# Patient Record
Sex: Male | Born: 1970 | Race: White | Hispanic: No | Marital: Married | Smoking: Never smoker
Health system: Southern US, Community
[De-identification: ages and names within clinical notes are randomized; demographics above are authoritative.]

## PROBLEM LIST (undated history)

## (undated) DIAGNOSIS — M1990 Unspecified osteoarthritis, unspecified site: Secondary | ICD-10-CM

## (undated) DIAGNOSIS — Z8669 Personal history of other diseases of the nervous system and sense organs: Secondary | ICD-10-CM

## (undated) DIAGNOSIS — I10 Essential (primary) hypertension: Secondary | ICD-10-CM

## (undated) DIAGNOSIS — G8929 Other chronic pain: Secondary | ICD-10-CM

## (undated) DIAGNOSIS — E785 Hyperlipidemia, unspecified: Secondary | ICD-10-CM

## (undated) DIAGNOSIS — F4310 Post-traumatic stress disorder, unspecified: Secondary | ICD-10-CM

## (undated) DIAGNOSIS — Z87898 Personal history of other specified conditions: Secondary | ICD-10-CM

## (undated) HISTORY — DX: Other chronic pain: G89.29

## (undated) HISTORY — PX: NECK SURGERY: SHX720

## (undated) HISTORY — DX: Essential (primary) hypertension: I10

## (undated) HISTORY — PX: HERNIA REPAIR: SHX51

## (undated) HISTORY — DX: Post-traumatic stress disorder, unspecified: F43.10

## (undated) HISTORY — DX: Personal history of other diseases of the nervous system and sense organs: Z86.69

## (undated) HISTORY — DX: Personal history of other specified conditions: Z87.898

## (undated) HISTORY — DX: Unspecified osteoarthritis, unspecified site: M19.90

## (undated) HISTORY — DX: Hyperlipidemia, unspecified: E78.5

---

## 2003-07-18 HISTORY — PX: WRIST SURGERY: SHX841

## 2003-07-18 HISTORY — PX: HIP SURGERY: SHX245

## 2003-07-18 HISTORY — PX: OTHER SURGICAL HISTORY: SHX169

## 2003-07-18 HISTORY — PX: KNEE SURGERY: SHX244

## 2003-07-18 HISTORY — PX: HERNIA REPAIR: SHX51

## 2004-08-01 ENCOUNTER — Encounter: Admission: RE | Admit: 2004-08-01 | Discharge: 2004-08-01 | Payer: Self-pay | Admitting: Orthopedic Surgery

## 2009-08-17 ENCOUNTER — Encounter: Admission: RE | Admit: 2009-08-17 | Discharge: 2009-08-17 | Payer: Self-pay | Admitting: Orthopaedic Surgery

## 2011-07-19 DIAGNOSIS — S32409A Unspecified fracture of unspecified acetabulum, initial encounter for closed fracture: Secondary | ICD-10-CM | POA: Diagnosis not present

## 2011-09-19 DIAGNOSIS — M169 Osteoarthritis of hip, unspecified: Secondary | ICD-10-CM | POA: Diagnosis not present

## 2011-09-19 DIAGNOSIS — IMO0002 Reserved for concepts with insufficient information to code with codable children: Secondary | ICD-10-CM | POA: Diagnosis not present

## 2011-09-19 DIAGNOSIS — I10 Essential (primary) hypertension: Secondary | ICD-10-CM | POA: Insufficient documentation

## 2011-09-19 DIAGNOSIS — K219 Gastro-esophageal reflux disease without esophagitis: Secondary | ICD-10-CM | POA: Insufficient documentation

## 2011-09-19 DIAGNOSIS — E7800 Pure hypercholesterolemia, unspecified: Secondary | ICD-10-CM | POA: Insufficient documentation

## 2011-09-19 DIAGNOSIS — Z09 Encounter for follow-up examination after completed treatment for conditions other than malignant neoplasm: Secondary | ICD-10-CM | POA: Diagnosis not present

## 2011-09-19 DIAGNOSIS — M1710 Unilateral primary osteoarthritis, unspecified knee: Secondary | ICD-10-CM | POA: Diagnosis not present

## 2011-10-05 DIAGNOSIS — G4700 Insomnia, unspecified: Secondary | ICD-10-CM | POA: Diagnosis not present

## 2011-10-05 DIAGNOSIS — Z125 Encounter for screening for malignant neoplasm of prostate: Secondary | ICD-10-CM | POA: Diagnosis not present

## 2011-10-05 DIAGNOSIS — E785 Hyperlipidemia, unspecified: Secondary | ICD-10-CM | POA: Diagnosis not present

## 2011-10-05 DIAGNOSIS — M1990 Unspecified osteoarthritis, unspecified site: Secondary | ICD-10-CM | POA: Diagnosis not present

## 2011-10-05 DIAGNOSIS — I10 Essential (primary) hypertension: Secondary | ICD-10-CM | POA: Diagnosis not present

## 2012-01-04 DIAGNOSIS — Z6837 Body mass index (BMI) 37.0-37.9, adult: Secondary | ICD-10-CM | POA: Diagnosis not present

## 2012-01-04 DIAGNOSIS — M1990 Unspecified osteoarthritis, unspecified site: Secondary | ICD-10-CM | POA: Diagnosis not present

## 2012-01-04 DIAGNOSIS — Z79899 Other long term (current) drug therapy: Secondary | ICD-10-CM | POA: Diagnosis not present

## 2012-01-04 DIAGNOSIS — G4700 Insomnia, unspecified: Secondary | ICD-10-CM | POA: Diagnosis not present

## 2012-01-04 DIAGNOSIS — E785 Hyperlipidemia, unspecified: Secondary | ICD-10-CM | POA: Diagnosis not present

## 2012-01-04 DIAGNOSIS — E669 Obesity, unspecified: Secondary | ICD-10-CM | POA: Diagnosis not present

## 2012-01-04 DIAGNOSIS — I10 Essential (primary) hypertension: Secondary | ICD-10-CM | POA: Diagnosis not present

## 2012-03-22 DIAGNOSIS — IMO0002 Reserved for concepts with insufficient information to code with codable children: Secondary | ICD-10-CM | POA: Diagnosis not present

## 2012-03-22 DIAGNOSIS — M1710 Unilateral primary osteoarthritis, unspecified knee: Secondary | ICD-10-CM | POA: Diagnosis not present

## 2012-03-22 DIAGNOSIS — G8921 Chronic pain due to trauma: Secondary | ICD-10-CM | POA: Diagnosis not present

## 2012-05-02 DIAGNOSIS — I10 Essential (primary) hypertension: Secondary | ICD-10-CM | POA: Diagnosis not present

## 2012-05-02 DIAGNOSIS — G4700 Insomnia, unspecified: Secondary | ICD-10-CM | POA: Diagnosis not present

## 2012-05-02 DIAGNOSIS — E785 Hyperlipidemia, unspecified: Secondary | ICD-10-CM | POA: Diagnosis not present

## 2012-05-02 DIAGNOSIS — M1990 Unspecified osteoarthritis, unspecified site: Secondary | ICD-10-CM | POA: Diagnosis not present

## 2012-07-29 DIAGNOSIS — M1710 Unilateral primary osteoarthritis, unspecified knee: Secondary | ICD-10-CM | POA: Diagnosis not present

## 2012-07-29 DIAGNOSIS — IMO0001 Reserved for inherently not codable concepts without codable children: Secondary | ICD-10-CM | POA: Diagnosis not present

## 2012-07-29 DIAGNOSIS — K219 Gastro-esophageal reflux disease without esophagitis: Secondary | ICD-10-CM | POA: Diagnosis not present

## 2012-07-29 DIAGNOSIS — M129 Arthropathy, unspecified: Secondary | ICD-10-CM | POA: Diagnosis not present

## 2012-07-29 DIAGNOSIS — I10 Essential (primary) hypertension: Secondary | ICD-10-CM | POA: Diagnosis not present

## 2012-07-29 DIAGNOSIS — M76899 Other specified enthesopathies of unspecified lower limb, excluding foot: Secondary | ICD-10-CM | POA: Diagnosis not present

## 2012-07-29 DIAGNOSIS — G8921 Chronic pain due to trauma: Secondary | ICD-10-CM | POA: Diagnosis not present

## 2012-07-29 DIAGNOSIS — S32409A Unspecified fracture of unspecified acetabulum, initial encounter for closed fracture: Secondary | ICD-10-CM | POA: Diagnosis not present

## 2012-07-29 DIAGNOSIS — Z09 Encounter for follow-up examination after completed treatment for conditions other than malignant neoplasm: Secondary | ICD-10-CM | POA: Diagnosis not present

## 2012-09-02 DIAGNOSIS — I10 Essential (primary) hypertension: Secondary | ICD-10-CM | POA: Diagnosis not present

## 2012-09-02 DIAGNOSIS — G4700 Insomnia, unspecified: Secondary | ICD-10-CM | POA: Diagnosis not present

## 2012-09-02 DIAGNOSIS — M1990 Unspecified osteoarthritis, unspecified site: Secondary | ICD-10-CM | POA: Diagnosis not present

## 2012-09-02 DIAGNOSIS — E785 Hyperlipidemia, unspecified: Secondary | ICD-10-CM | POA: Diagnosis not present

## 2012-10-18 DIAGNOSIS — IMO0002 Reserved for concepts with insufficient information to code with codable children: Secondary | ICD-10-CM | POA: Diagnosis not present

## 2012-10-18 DIAGNOSIS — M2340 Loose body in knee, unspecified knee: Secondary | ICD-10-CM | POA: Diagnosis not present

## 2012-10-18 DIAGNOSIS — M1710 Unilateral primary osteoarthritis, unspecified knee: Secondary | ICD-10-CM | POA: Diagnosis not present

## 2013-01-24 DIAGNOSIS — I10 Essential (primary) hypertension: Secondary | ICD-10-CM | POA: Diagnosis not present

## 2013-01-24 DIAGNOSIS — M1990 Unspecified osteoarthritis, unspecified site: Secondary | ICD-10-CM | POA: Diagnosis not present

## 2013-01-24 DIAGNOSIS — E785 Hyperlipidemia, unspecified: Secondary | ICD-10-CM | POA: Diagnosis not present

## 2013-01-24 DIAGNOSIS — G4700 Insomnia, unspecified: Secondary | ICD-10-CM | POA: Diagnosis not present

## 2013-04-18 DIAGNOSIS — M1710 Unilateral primary osteoarthritis, unspecified knee: Secondary | ICD-10-CM | POA: Diagnosis not present

## 2013-04-18 DIAGNOSIS — IMO0002 Reserved for concepts with insufficient information to code with codable children: Secondary | ICD-10-CM | POA: Diagnosis not present

## 2013-04-18 DIAGNOSIS — Z9889 Other specified postprocedural states: Secondary | ICD-10-CM | POA: Diagnosis not present

## 2013-05-26 DIAGNOSIS — G4700 Insomnia, unspecified: Secondary | ICD-10-CM | POA: Diagnosis not present

## 2013-05-26 DIAGNOSIS — M1990 Unspecified osteoarthritis, unspecified site: Secondary | ICD-10-CM | POA: Diagnosis not present

## 2013-05-26 DIAGNOSIS — E785 Hyperlipidemia, unspecified: Secondary | ICD-10-CM | POA: Diagnosis not present

## 2013-05-26 DIAGNOSIS — I10 Essential (primary) hypertension: Secondary | ICD-10-CM | POA: Diagnosis not present

## 2013-05-28 DIAGNOSIS — I6350 Cerebral infarction due to unspecified occlusion or stenosis of unspecified cerebral artery: Secondary | ICD-10-CM | POA: Diagnosis not present

## 2013-05-28 DIAGNOSIS — Z79899 Other long term (current) drug therapy: Secondary | ICD-10-CM | POA: Diagnosis not present

## 2013-05-28 DIAGNOSIS — I10 Essential (primary) hypertension: Secondary | ICD-10-CM | POA: Diagnosis not present

## 2013-05-28 DIAGNOSIS — R51 Headache: Secondary | ICD-10-CM | POA: Diagnosis not present

## 2013-05-28 DIAGNOSIS — G510 Bell's palsy: Secondary | ICD-10-CM | POA: Diagnosis not present

## 2013-05-30 DIAGNOSIS — G510 Bell's palsy: Secondary | ICD-10-CM | POA: Diagnosis not present

## 2013-07-30 DIAGNOSIS — IMO0002 Reserved for concepts with insufficient information to code with codable children: Secondary | ICD-10-CM | POA: Diagnosis not present

## 2013-07-30 DIAGNOSIS — G8921 Chronic pain due to trauma: Secondary | ICD-10-CM | POA: Diagnosis not present

## 2013-07-30 DIAGNOSIS — S72009D Fracture of unspecified part of neck of unspecified femur, subsequent encounter for closed fracture with routine healing: Secondary | ICD-10-CM | POA: Diagnosis not present

## 2013-07-30 DIAGNOSIS — S32409A Unspecified fracture of unspecified acetabulum, initial encounter for closed fracture: Secondary | ICD-10-CM | POA: Diagnosis not present

## 2013-10-17 DIAGNOSIS — M1710 Unilateral primary osteoarthritis, unspecified knee: Secondary | ICD-10-CM | POA: Diagnosis not present

## 2013-10-17 DIAGNOSIS — M175 Other unilateral secondary osteoarthritis of knee: Secondary | ICD-10-CM | POA: Diagnosis not present

## 2013-10-17 DIAGNOSIS — Z9889 Other specified postprocedural states: Secondary | ICD-10-CM | POA: Diagnosis not present

## 2013-10-17 DIAGNOSIS — M167 Other unilateral secondary osteoarthritis of hip: Secondary | ICD-10-CM | POA: Diagnosis not present

## 2013-12-03 DIAGNOSIS — G4700 Insomnia, unspecified: Secondary | ICD-10-CM | POA: Diagnosis not present

## 2013-12-03 DIAGNOSIS — E785 Hyperlipidemia, unspecified: Secondary | ICD-10-CM | POA: Diagnosis not present

## 2013-12-03 DIAGNOSIS — I10 Essential (primary) hypertension: Secondary | ICD-10-CM | POA: Diagnosis not present

## 2014-01-05 DIAGNOSIS — I10 Essential (primary) hypertension: Secondary | ICD-10-CM | POA: Diagnosis not present

## 2014-01-05 DIAGNOSIS — R748 Abnormal levels of other serum enzymes: Secondary | ICD-10-CM | POA: Diagnosis not present

## 2014-01-05 DIAGNOSIS — E785 Hyperlipidemia, unspecified: Secondary | ICD-10-CM | POA: Diagnosis not present

## 2014-06-01 DIAGNOSIS — I10 Essential (primary) hypertension: Secondary | ICD-10-CM | POA: Diagnosis not present

## 2014-06-01 DIAGNOSIS — E785 Hyperlipidemia, unspecified: Secondary | ICD-10-CM | POA: Diagnosis not present

## 2014-06-01 DIAGNOSIS — M1990 Unspecified osteoarthritis, unspecified site: Secondary | ICD-10-CM | POA: Diagnosis not present

## 2014-06-01 DIAGNOSIS — G4700 Insomnia, unspecified: Secondary | ICD-10-CM | POA: Diagnosis not present

## 2014-06-01 DIAGNOSIS — Z6837 Body mass index (BMI) 37.0-37.9, adult: Secondary | ICD-10-CM | POA: Diagnosis not present

## 2014-09-07 DIAGNOSIS — I10 Essential (primary) hypertension: Secondary | ICD-10-CM | POA: Diagnosis not present

## 2014-09-07 DIAGNOSIS — Z760 Encounter for issue of repeat prescription: Secondary | ICD-10-CM | POA: Diagnosis not present

## 2014-09-07 DIAGNOSIS — Z9889 Other specified postprocedural states: Secondary | ICD-10-CM | POA: Diagnosis not present

## 2014-09-07 DIAGNOSIS — S32401D Unspecified fracture of right acetabulum, subsequent encounter for fracture with routine healing: Secondary | ICD-10-CM | POA: Diagnosis not present

## 2014-09-07 DIAGNOSIS — S32401S Unspecified fracture of right acetabulum, sequela: Secondary | ICD-10-CM | POA: Diagnosis not present

## 2014-10-21 DIAGNOSIS — M1651 Unilateral post-traumatic osteoarthritis, right hip: Secondary | ICD-10-CM | POA: Diagnosis not present

## 2014-10-21 DIAGNOSIS — M2341 Loose body in knee, right knee: Secondary | ICD-10-CM | POA: Diagnosis not present

## 2014-10-21 DIAGNOSIS — M1731 Unilateral post-traumatic osteoarthritis, right knee: Secondary | ICD-10-CM | POA: Diagnosis not present

## 2014-12-01 DIAGNOSIS — G4700 Insomnia, unspecified: Secondary | ICD-10-CM | POA: Diagnosis not present

## 2014-12-01 DIAGNOSIS — Z6836 Body mass index (BMI) 36.0-36.9, adult: Secondary | ICD-10-CM | POA: Diagnosis not present

## 2014-12-01 DIAGNOSIS — E785 Hyperlipidemia, unspecified: Secondary | ICD-10-CM | POA: Diagnosis not present

## 2014-12-01 DIAGNOSIS — M1990 Unspecified osteoarthritis, unspecified site: Secondary | ICD-10-CM | POA: Diagnosis not present

## 2014-12-01 DIAGNOSIS — I10 Essential (primary) hypertension: Secondary | ICD-10-CM | POA: Diagnosis not present

## 2015-05-21 DIAGNOSIS — G4700 Insomnia, unspecified: Secondary | ICD-10-CM | POA: Diagnosis not present

## 2015-05-21 DIAGNOSIS — E785 Hyperlipidemia, unspecified: Secondary | ICD-10-CM | POA: Diagnosis not present

## 2015-05-21 DIAGNOSIS — M1990 Unspecified osteoarthritis, unspecified site: Secondary | ICD-10-CM | POA: Diagnosis not present

## 2015-05-21 DIAGNOSIS — S00522A Blister (nonthermal) of oral cavity, initial encounter: Secondary | ICD-10-CM | POA: Diagnosis not present

## 2015-05-21 DIAGNOSIS — Z6835 Body mass index (BMI) 35.0-35.9, adult: Secondary | ICD-10-CM | POA: Diagnosis not present

## 2015-05-31 DIAGNOSIS — E785 Hyperlipidemia, unspecified: Secondary | ICD-10-CM | POA: Diagnosis not present

## 2015-05-31 DIAGNOSIS — Z6835 Body mass index (BMI) 35.0-35.9, adult: Secondary | ICD-10-CM | POA: Diagnosis not present

## 2015-05-31 DIAGNOSIS — Z0000 Encounter for general adult medical examination without abnormal findings: Secondary | ICD-10-CM | POA: Diagnosis not present

## 2015-05-31 DIAGNOSIS — I10 Essential (primary) hypertension: Secondary | ICD-10-CM | POA: Diagnosis not present

## 2015-05-31 DIAGNOSIS — M1990 Unspecified osteoarthritis, unspecified site: Secondary | ICD-10-CM | POA: Diagnosis not present

## 2015-09-08 DIAGNOSIS — I10 Essential (primary) hypertension: Secondary | ICD-10-CM | POA: Diagnosis not present

## 2015-09-08 DIAGNOSIS — S32401D Unspecified fracture of right acetabulum, subsequent encounter for fracture with routine healing: Secondary | ICD-10-CM | POA: Diagnosis not present

## 2015-09-08 DIAGNOSIS — Z79899 Other long term (current) drug therapy: Secondary | ICD-10-CM | POA: Diagnosis not present

## 2015-09-08 DIAGNOSIS — E7800 Pure hypercholesterolemia, unspecified: Secondary | ICD-10-CM | POA: Diagnosis not present

## 2015-09-08 DIAGNOSIS — Z7982 Long term (current) use of aspirin: Secondary | ICD-10-CM | POA: Diagnosis not present

## 2015-11-16 DIAGNOSIS — M1731 Unilateral post-traumatic osteoarthritis, right knee: Secondary | ICD-10-CM | POA: Diagnosis not present

## 2015-12-21 DIAGNOSIS — F4312 Post-traumatic stress disorder, chronic: Secondary | ICD-10-CM | POA: Diagnosis not present

## 2015-12-21 DIAGNOSIS — Z6836 Body mass index (BMI) 36.0-36.9, adult: Secondary | ICD-10-CM | POA: Diagnosis not present

## 2015-12-21 DIAGNOSIS — E785 Hyperlipidemia, unspecified: Secondary | ICD-10-CM | POA: Diagnosis not present

## 2015-12-21 DIAGNOSIS — I10 Essential (primary) hypertension: Secondary | ICD-10-CM | POA: Diagnosis not present

## 2015-12-21 DIAGNOSIS — G4700 Insomnia, unspecified: Secondary | ICD-10-CM | POA: Diagnosis not present

## 2015-12-21 DIAGNOSIS — M1990 Unspecified osteoarthritis, unspecified site: Secondary | ICD-10-CM | POA: Diagnosis not present

## 2016-02-28 DIAGNOSIS — G8921 Chronic pain due to trauma: Secondary | ICD-10-CM | POA: Diagnosis not present

## 2016-02-28 DIAGNOSIS — Z6835 Body mass index (BMI) 35.0-35.9, adult: Secondary | ICD-10-CM | POA: Diagnosis not present

## 2016-02-28 DIAGNOSIS — F4312 Post-traumatic stress disorder, chronic: Secondary | ICD-10-CM | POA: Diagnosis not present

## 2016-02-28 DIAGNOSIS — M1990 Unspecified osteoarthritis, unspecified site: Secondary | ICD-10-CM | POA: Diagnosis not present

## 2016-05-29 DIAGNOSIS — G894 Chronic pain syndrome: Secondary | ICD-10-CM | POA: Diagnosis not present

## 2016-05-29 DIAGNOSIS — M159 Polyosteoarthritis, unspecified: Secondary | ICD-10-CM | POA: Diagnosis not present

## 2016-05-29 DIAGNOSIS — M25551 Pain in right hip: Secondary | ICD-10-CM | POA: Diagnosis not present

## 2016-05-29 DIAGNOSIS — Z79891 Long term (current) use of opiate analgesic: Secondary | ICD-10-CM | POA: Diagnosis not present

## 2016-06-19 DIAGNOSIS — M545 Low back pain: Secondary | ICD-10-CM | POA: Diagnosis not present

## 2016-06-19 DIAGNOSIS — G8921 Chronic pain due to trauma: Secondary | ICD-10-CM | POA: Diagnosis not present

## 2016-06-19 DIAGNOSIS — G4700 Insomnia, unspecified: Secondary | ICD-10-CM | POA: Diagnosis not present

## 2016-06-19 DIAGNOSIS — I10 Essential (primary) hypertension: Secondary | ICD-10-CM | POA: Diagnosis not present

## 2016-06-19 DIAGNOSIS — Z6837 Body mass index (BMI) 37.0-37.9, adult: Secondary | ICD-10-CM | POA: Diagnosis not present

## 2016-06-19 DIAGNOSIS — E785 Hyperlipidemia, unspecified: Secondary | ICD-10-CM | POA: Diagnosis not present

## 2016-06-19 DIAGNOSIS — F4312 Post-traumatic stress disorder, chronic: Secondary | ICD-10-CM | POA: Diagnosis not present

## 2016-09-13 DIAGNOSIS — M25361 Other instability, right knee: Secondary | ICD-10-CM | POA: Diagnosis not present

## 2016-09-13 DIAGNOSIS — M1731 Unilateral post-traumatic osteoarthritis, right knee: Secondary | ICD-10-CM | POA: Diagnosis not present

## 2016-09-13 DIAGNOSIS — Z9889 Other specified postprocedural states: Secondary | ICD-10-CM | POA: Diagnosis not present

## 2016-09-13 DIAGNOSIS — S32401D Unspecified fracture of right acetabulum, subsequent encounter for fracture with routine healing: Secondary | ICD-10-CM | POA: Diagnosis not present

## 2016-09-13 DIAGNOSIS — M7061 Trochanteric bursitis, right hip: Secondary | ICD-10-CM | POA: Diagnosis not present

## 2016-12-13 DIAGNOSIS — G4700 Insomnia, unspecified: Secondary | ICD-10-CM | POA: Diagnosis not present

## 2016-12-13 DIAGNOSIS — Z6837 Body mass index (BMI) 37.0-37.9, adult: Secondary | ICD-10-CM | POA: Diagnosis not present

## 2016-12-13 DIAGNOSIS — M545 Low back pain: Secondary | ICD-10-CM | POA: Diagnosis not present

## 2016-12-13 DIAGNOSIS — I10 Essential (primary) hypertension: Secondary | ICD-10-CM | POA: Diagnosis not present

## 2016-12-13 DIAGNOSIS — M1990 Unspecified osteoarthritis, unspecified site: Secondary | ICD-10-CM | POA: Diagnosis not present

## 2016-12-13 DIAGNOSIS — E785 Hyperlipidemia, unspecified: Secondary | ICD-10-CM | POA: Diagnosis not present

## 2016-12-13 DIAGNOSIS — F4312 Post-traumatic stress disorder, chronic: Secondary | ICD-10-CM | POA: Diagnosis not present

## 2017-01-30 DIAGNOSIS — Z79899 Other long term (current) drug therapy: Secondary | ICD-10-CM | POA: Diagnosis not present

## 2017-01-30 DIAGNOSIS — I10 Essential (primary) hypertension: Secondary | ICD-10-CM | POA: Diagnosis not present

## 2017-01-30 DIAGNOSIS — D508 Other iron deficiency anemias: Secondary | ICD-10-CM | POA: Diagnosis not present

## 2017-02-02 DIAGNOSIS — M545 Low back pain: Secondary | ICD-10-CM | POA: Diagnosis not present

## 2017-03-22 DIAGNOSIS — Z6837 Body mass index (BMI) 37.0-37.9, adult: Secondary | ICD-10-CM | POA: Diagnosis not present

## 2017-03-22 DIAGNOSIS — M5414 Radiculopathy, thoracic region: Secondary | ICD-10-CM | POA: Diagnosis not present

## 2017-04-19 DIAGNOSIS — Z6837 Body mass index (BMI) 37.0-37.9, adult: Secondary | ICD-10-CM | POA: Diagnosis not present

## 2017-04-19 DIAGNOSIS — M5412 Radiculopathy, cervical region: Secondary | ICD-10-CM | POA: Diagnosis not present

## 2017-04-19 DIAGNOSIS — M5414 Radiculopathy, thoracic region: Secondary | ICD-10-CM | POA: Diagnosis not present

## 2017-06-11 DIAGNOSIS — I10 Essential (primary) hypertension: Secondary | ICD-10-CM | POA: Diagnosis not present

## 2017-06-11 DIAGNOSIS — G4700 Insomnia, unspecified: Secondary | ICD-10-CM | POA: Diagnosis not present

## 2017-06-11 DIAGNOSIS — E785 Hyperlipidemia, unspecified: Secondary | ICD-10-CM | POA: Diagnosis not present

## 2017-06-11 DIAGNOSIS — Z6837 Body mass index (BMI) 37.0-37.9, adult: Secondary | ICD-10-CM | POA: Diagnosis not present

## 2017-06-11 DIAGNOSIS — M1990 Unspecified osteoarthritis, unspecified site: Secondary | ICD-10-CM | POA: Diagnosis not present

## 2017-06-13 ENCOUNTER — Other Ambulatory Visit: Payer: Self-pay | Admitting: Nurse Practitioner

## 2017-06-13 DIAGNOSIS — M542 Cervicalgia: Secondary | ICD-10-CM

## 2017-06-22 ENCOUNTER — Ambulatory Visit
Admission: RE | Admit: 2017-06-22 | Discharge: 2017-06-22 | Disposition: A | Discharge status: Home / Self Care | Payer: Medicare Other | Source: Ambulatory Visit | Attending: Nurse Practitioner | Admitting: Nurse Practitioner

## 2017-06-22 DIAGNOSIS — M542 Cervicalgia: Secondary | ICD-10-CM

## 2017-06-22 DIAGNOSIS — M4802 Spinal stenosis, cervical region: Secondary | ICD-10-CM | POA: Diagnosis not present

## 2018-09-23 IMAGING — MR MR CERVICAL SPINE W/O CM
5 series · 28 of 48 positions shown · non-contrast
Comparison: None available.

CLINICAL DATA: Initial evaluation for 3 month history of severe
cervical pain radiating into right upper extremity with tingling and
weakness.

EXAM:
MRI CERVICAL SPINE WITHOUT CONTRAST
TECHNIQUE: Multiplanar, multisequence MR imaging of the cervical spine was
performed. No intravenous contrast was administered.

[Series 3: T2 · sagittal · 3.0mm · 0.66mm/px · 6 of 15 slices shown (1 of 2)]
[im 1/15]
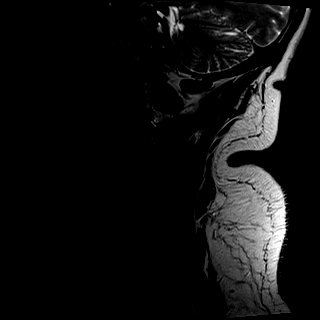
[im 3/15]
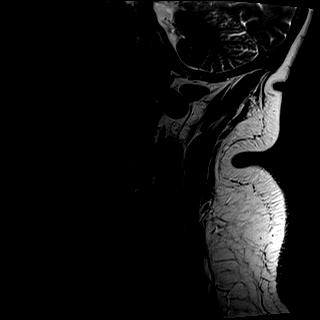
[im 6/15]
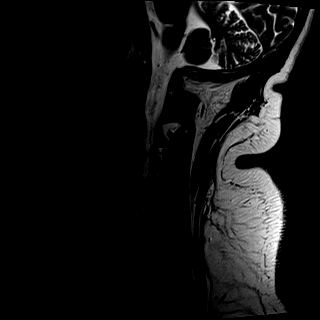
[im 9/15]
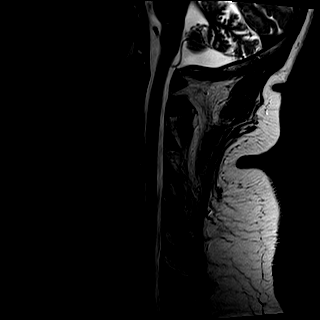
[im 12/15]
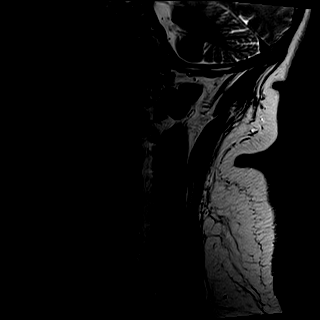
[im 15/15]
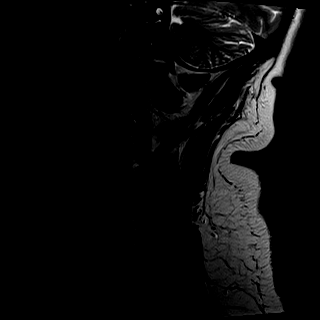

[Series 4: tir sag · sagittal · 3.0mm · 0.41mm/px · 6 of 15 slices shown]
[im 1/15]
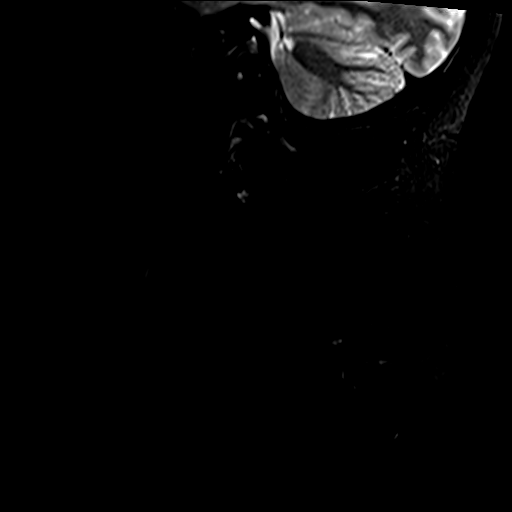
[im 3/15]
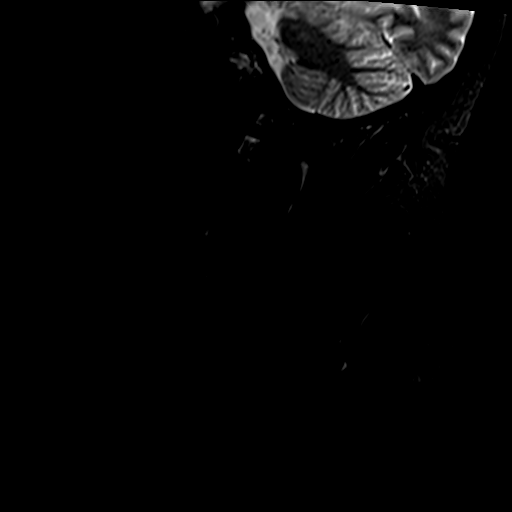
[im 6/15]
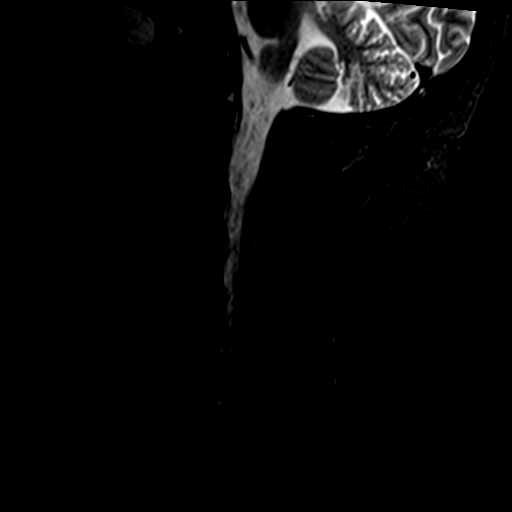
[im 9/15]
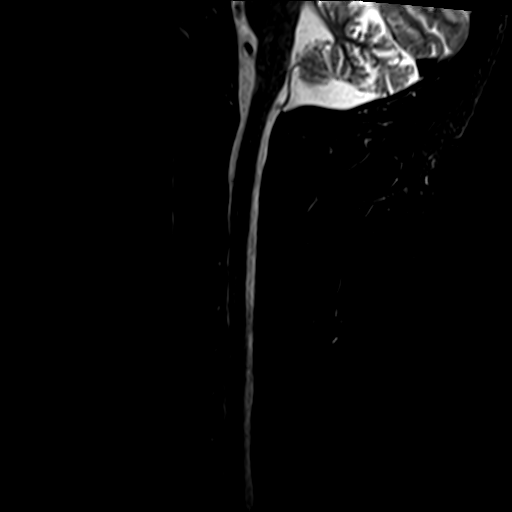
[im 12/15]
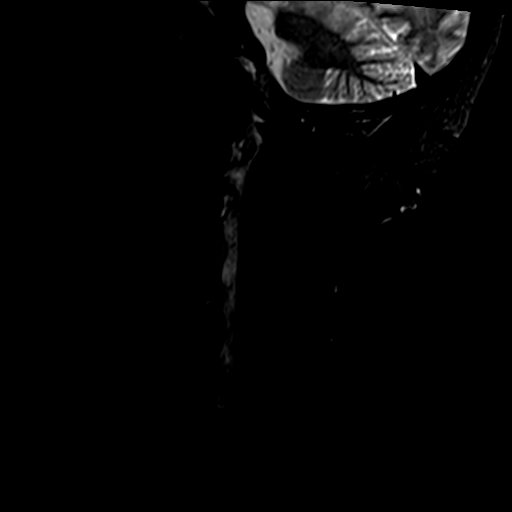
[im 15/15]
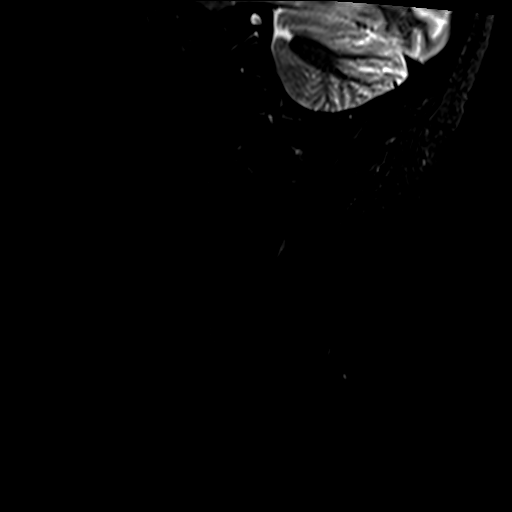

[Series 5: T1 · sagittal · 3.0mm · 0.41mm/px · 6 of 15 slices shown]
[im 1/15]
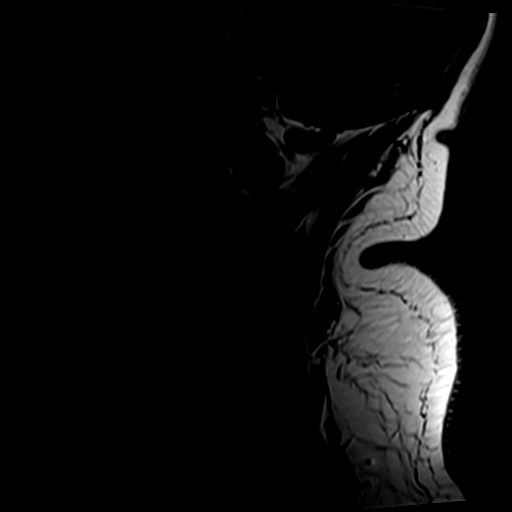
[im 3/15]
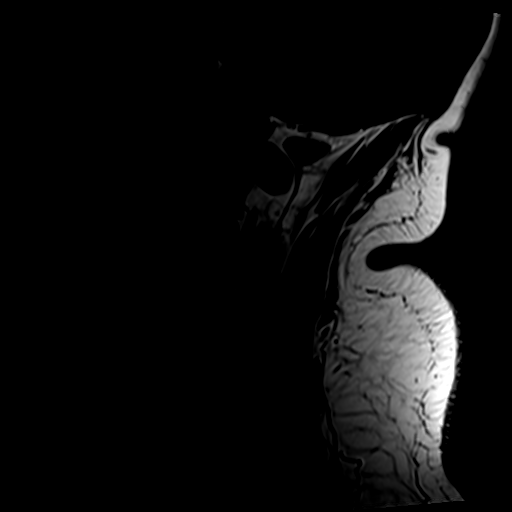
[im 6/15]
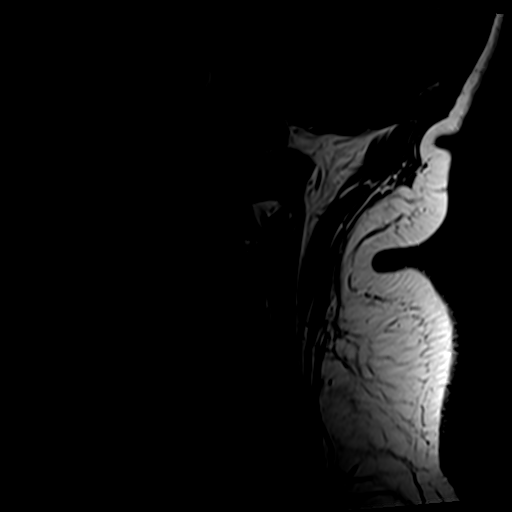
[im 9/15]
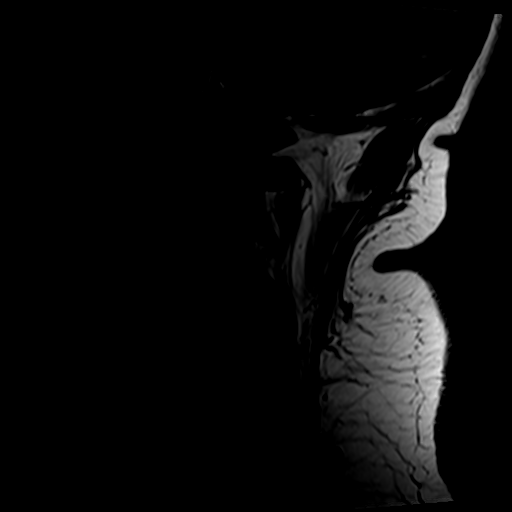
[im 12/15]
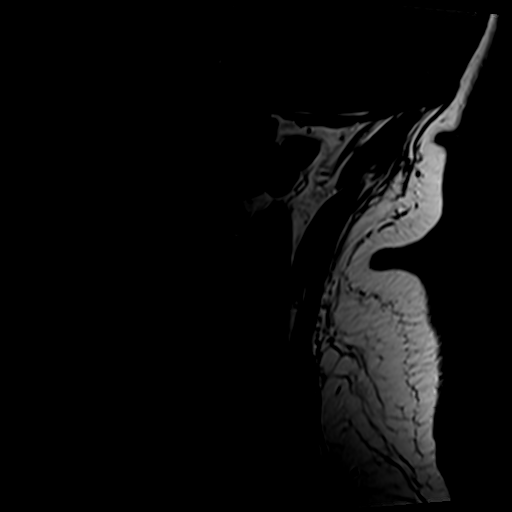
[im 15/15]
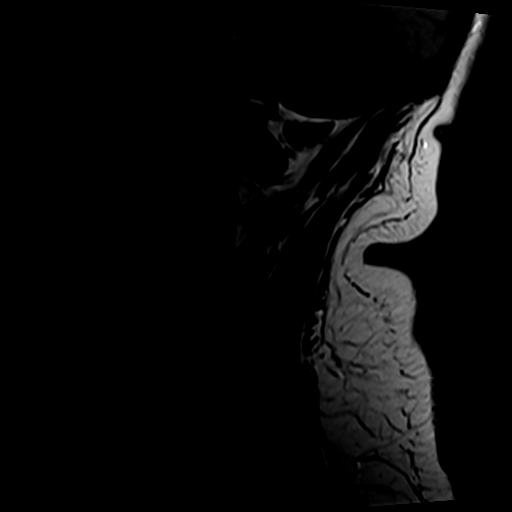

[Series 6: GRE · axial · 3.0mm · 0.35mm/px · 1 of 35 slices shown]
[im 1/35]
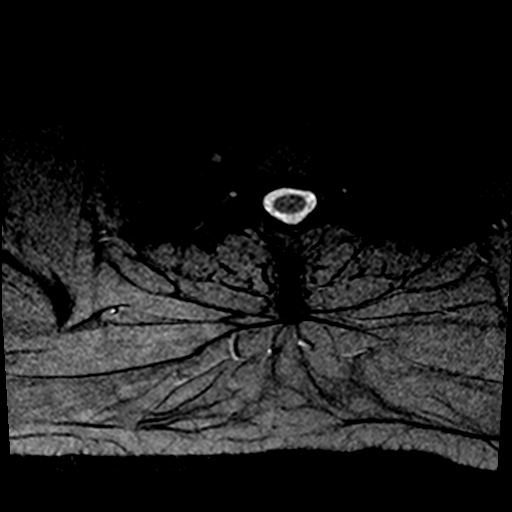

[Series 7: T2 · axial · 3.0mm · 0.70mm/px · z∈[-128,-3]mm · 9 of 35 slices shown (2 of 2)]
[im 1/35]
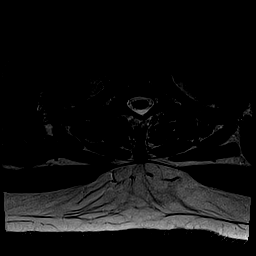
[im 5/35]
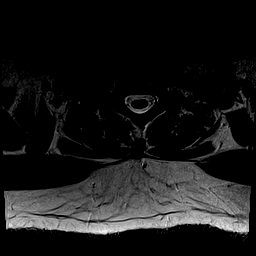
[im 10/35]
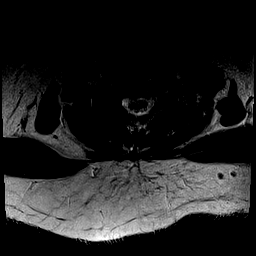
[im 15/35]
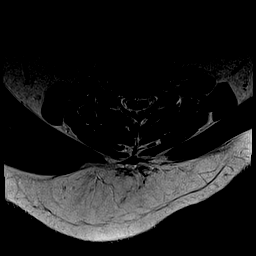
[im 18/35]
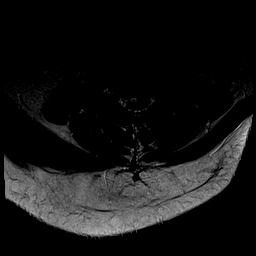
[im 20/35]
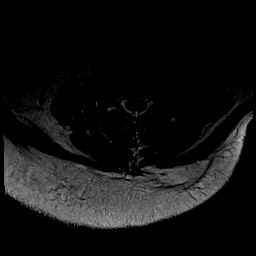
[im 25/35]
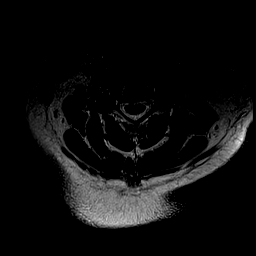
[im 30/35]
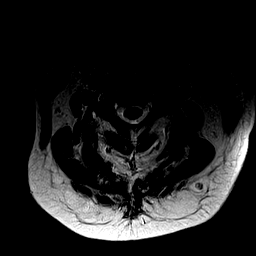
[im 35/35]
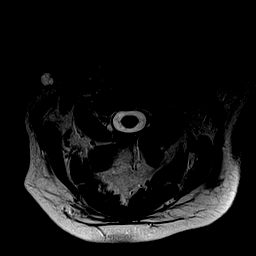

[28 of 48 positions shown; findings below may reference images not displayed]

FINDINGS: Alignment: Straightening of the normal cervical lordosis. No
listhesis.

Vertebrae: Vertebral body heights are well maintained without
evidence for acute or chronic fracture. Bone marrow signal intensity
within normal limits. No discrete or worrisome osseous lesions. No
abnormal marrow edema.

Cord: Signal intensity within the cervical spinal cord is normal.

Posterior Fossa, vertebral arteries, paraspinal tissues: Visualized
brain and posterior fossa within normal limits. Craniocervical
junction normal. Paraspinous and prevertebral soft tissues normal.
Normal intravascular flow voids present within the vertebral
arteries bilaterally. 14 mm multi cystic T2 hyperintense lesion
noted within the right parotid gland (series 7, image 2),
indeterminate.

Disc levels:

C2-C3: Minimal uncovertebral spurring with left-sided facet
hypertrophy. No stenosis.

C3-C4: Mild right-sided uncovertebral hypertrophy. No significant
canal stenosis. Borderline right foraminal narrowing.

C4-C5:  Mild diffuse disc bulge.  No significant stenosis.

C5-C6: Diffuse disc bulge with mild uncovertebral hypertrophy.
Bulging disc flattens the ventral thecal sac with resultant mild
spinal stenosis. Moderate bilateral C6 foraminal narrowing, right
slightly worse than left.

C6-C7: Broad right foraminal disc protrusion (series 6, image 24).
Superimposed mild right uncovertebral spurring. Protruding disc
flattens the right ventral thecal sac and right hemi cord without
cord signal changes. Resultant right foraminal stenosis with
probable C7 nerve root impingement. Mild spinal stenosis. Mild left
foraminal narrowing related to disc bulge and uncovertebral disease.

C7-T1: Shallow posterior disc bulge. Mild facet hypertrophy. No
stenosis.

Visualized upper thoracic spine within normal limits.
IMPRESSION: 1. Moderate sized right foraminal disc protrusion at C6-7 with
resultant severe right foraminal stenosis and probable C7 nerve root
impingement.
2. Diffuse disc bulge at C5-6 with resultant mild canal and moderate
bilateral C6 foraminal stenosis.
3. 14 mm T2 hyperintense right parotid lobe lesion, indeterminate.
Nonemergent ENT consultation and referral suggested.

## 2020-07-28 DIAGNOSIS — Z1389 Encounter for screening for other disorder: Secondary | ICD-10-CM | POA: Diagnosis not present

## 2020-07-28 DIAGNOSIS — M1250 Traumatic arthropathy, unspecified site: Secondary | ICD-10-CM | POA: Diagnosis not present

## 2020-07-28 DIAGNOSIS — Z79891 Long term (current) use of opiate analgesic: Secondary | ICD-10-CM | POA: Diagnosis not present

## 2020-07-28 DIAGNOSIS — R69 Illness, unspecified: Secondary | ICD-10-CM | POA: Diagnosis not present

## 2020-08-25 DIAGNOSIS — R69 Illness, unspecified: Secondary | ICD-10-CM | POA: Diagnosis not present

## 2020-08-25 DIAGNOSIS — M1250 Traumatic arthropathy, unspecified site: Secondary | ICD-10-CM | POA: Diagnosis not present

## 2020-08-25 DIAGNOSIS — Z1389 Encounter for screening for other disorder: Secondary | ICD-10-CM | POA: Diagnosis not present

## 2020-08-25 DIAGNOSIS — G894 Chronic pain syndrome: Secondary | ICD-10-CM | POA: Diagnosis not present

## 2020-08-25 DIAGNOSIS — Z79891 Long term (current) use of opiate analgesic: Secondary | ICD-10-CM | POA: Diagnosis not present

## 2020-09-20 DIAGNOSIS — Z6837 Body mass index (BMI) 37.0-37.9, adult: Secondary | ICD-10-CM | POA: Diagnosis not present

## 2020-09-20 DIAGNOSIS — K5900 Constipation, unspecified: Secondary | ICD-10-CM | POA: Diagnosis not present

## 2020-09-20 DIAGNOSIS — I10 Essential (primary) hypertension: Secondary | ICD-10-CM | POA: Diagnosis not present

## 2020-09-20 DIAGNOSIS — E785 Hyperlipidemia, unspecified: Secondary | ICD-10-CM | POA: Diagnosis not present

## 2020-09-20 DIAGNOSIS — M1990 Unspecified osteoarthritis, unspecified site: Secondary | ICD-10-CM | POA: Diagnosis not present

## 2020-09-20 DIAGNOSIS — G8929 Other chronic pain: Secondary | ICD-10-CM | POA: Diagnosis not present

## 2020-09-20 DIAGNOSIS — Z791 Long term (current) use of non-steroidal anti-inflammatories (NSAID): Secondary | ICD-10-CM | POA: Diagnosis not present

## 2020-09-20 DIAGNOSIS — R69 Illness, unspecified: Secondary | ICD-10-CM | POA: Diagnosis not present

## 2020-09-22 DIAGNOSIS — Z1389 Encounter for screening for other disorder: Secondary | ICD-10-CM | POA: Diagnosis not present

## 2020-09-22 DIAGNOSIS — M1250 Traumatic arthropathy, unspecified site: Secondary | ICD-10-CM | POA: Diagnosis not present

## 2020-09-22 DIAGNOSIS — R69 Illness, unspecified: Secondary | ICD-10-CM | POA: Diagnosis not present

## 2020-09-22 DIAGNOSIS — Z79891 Long term (current) use of opiate analgesic: Secondary | ICD-10-CM | POA: Diagnosis not present

## 2020-10-20 DIAGNOSIS — R69 Illness, unspecified: Secondary | ICD-10-CM | POA: Diagnosis not present

## 2020-10-20 DIAGNOSIS — Z1389 Encounter for screening for other disorder: Secondary | ICD-10-CM | POA: Diagnosis not present

## 2020-10-20 DIAGNOSIS — G894 Chronic pain syndrome: Secondary | ICD-10-CM | POA: Diagnosis not present

## 2020-10-20 DIAGNOSIS — M1250 Traumatic arthropathy, unspecified site: Secondary | ICD-10-CM | POA: Diagnosis not present

## 2020-10-20 DIAGNOSIS — Z79891 Long term (current) use of opiate analgesic: Secondary | ICD-10-CM | POA: Diagnosis not present

## 2020-11-17 DIAGNOSIS — Z79891 Long term (current) use of opiate analgesic: Secondary | ICD-10-CM | POA: Diagnosis not present

## 2020-11-17 DIAGNOSIS — M1250 Traumatic arthropathy, unspecified site: Secondary | ICD-10-CM | POA: Diagnosis not present

## 2020-11-17 DIAGNOSIS — R69 Illness, unspecified: Secondary | ICD-10-CM | POA: Diagnosis not present

## 2020-11-17 DIAGNOSIS — Z1389 Encounter for screening for other disorder: Secondary | ICD-10-CM | POA: Diagnosis not present

## 2020-12-14 DIAGNOSIS — I878 Other specified disorders of veins: Secondary | ICD-10-CM | POA: Diagnosis not present

## 2020-12-15 DIAGNOSIS — M1250 Traumatic arthropathy, unspecified site: Secondary | ICD-10-CM | POA: Diagnosis not present

## 2020-12-15 DIAGNOSIS — Z79891 Long term (current) use of opiate analgesic: Secondary | ICD-10-CM | POA: Diagnosis not present

## 2020-12-15 DIAGNOSIS — Z1389 Encounter for screening for other disorder: Secondary | ICD-10-CM | POA: Diagnosis not present

## 2020-12-15 DIAGNOSIS — R69 Illness, unspecified: Secondary | ICD-10-CM | POA: Diagnosis not present

## 2020-12-20 DIAGNOSIS — Z0001 Encounter for general adult medical examination with abnormal findings: Secondary | ICD-10-CM | POA: Diagnosis not present

## 2020-12-20 DIAGNOSIS — F4312 Post-traumatic stress disorder, chronic: Secondary | ICD-10-CM | POA: Diagnosis not present

## 2020-12-20 DIAGNOSIS — R69 Illness, unspecified: Secondary | ICD-10-CM | POA: Diagnosis not present

## 2020-12-20 DIAGNOSIS — I10 Essential (primary) hypertension: Secondary | ICD-10-CM | POA: Diagnosis not present

## 2020-12-20 DIAGNOSIS — E785 Hyperlipidemia, unspecified: Secondary | ICD-10-CM | POA: Diagnosis not present

## 2020-12-20 DIAGNOSIS — R7301 Impaired fasting glucose: Secondary | ICD-10-CM | POA: Diagnosis not present

## 2021-01-18 DIAGNOSIS — R69 Illness, unspecified: Secondary | ICD-10-CM | POA: Diagnosis not present

## 2021-01-18 DIAGNOSIS — Z79891 Long term (current) use of opiate analgesic: Secondary | ICD-10-CM | POA: Diagnosis not present

## 2021-01-18 DIAGNOSIS — Z1389 Encounter for screening for other disorder: Secondary | ICD-10-CM | POA: Diagnosis not present

## 2021-01-18 DIAGNOSIS — M1250 Traumatic arthropathy, unspecified site: Secondary | ICD-10-CM | POA: Diagnosis not present

## 2021-01-19 DIAGNOSIS — M25461 Effusion, right knee: Secondary | ICD-10-CM | POA: Diagnosis not present

## 2021-01-19 DIAGNOSIS — M1731 Unilateral post-traumatic osteoarthritis, right knee: Secondary | ICD-10-CM | POA: Diagnosis not present

## 2021-01-27 DIAGNOSIS — R609 Edema, unspecified: Secondary | ICD-10-CM | POA: Diagnosis not present

## 2021-01-27 DIAGNOSIS — R600 Localized edema: Secondary | ICD-10-CM | POA: Diagnosis not present

## 2021-01-27 DIAGNOSIS — I878 Other specified disorders of veins: Secondary | ICD-10-CM | POA: Diagnosis not present

## 2021-02-17 DIAGNOSIS — Z79891 Long term (current) use of opiate analgesic: Secondary | ICD-10-CM | POA: Diagnosis not present

## 2021-02-17 DIAGNOSIS — G629 Polyneuropathy, unspecified: Secondary | ICD-10-CM | POA: Diagnosis not present

## 2021-02-17 DIAGNOSIS — R69 Illness, unspecified: Secondary | ICD-10-CM | POA: Diagnosis not present

## 2021-02-17 DIAGNOSIS — M1250 Traumatic arthropathy, unspecified site: Secondary | ICD-10-CM | POA: Diagnosis not present

## 2021-02-17 DIAGNOSIS — Z1389 Encounter for screening for other disorder: Secondary | ICD-10-CM | POA: Diagnosis not present

## 2021-02-17 DIAGNOSIS — F4310 Post-traumatic stress disorder, unspecified: Secondary | ICD-10-CM | POA: Diagnosis not present

## 2021-02-17 DIAGNOSIS — G894 Chronic pain syndrome: Secondary | ICD-10-CM | POA: Diagnosis not present

## 2021-02-18 DIAGNOSIS — G629 Polyneuropathy, unspecified: Secondary | ICD-10-CM | POA: Diagnosis not present

## 2021-02-18 DIAGNOSIS — R69 Illness, unspecified: Secondary | ICD-10-CM | POA: Diagnosis not present

## 2021-02-18 DIAGNOSIS — Z79891 Long term (current) use of opiate analgesic: Secondary | ICD-10-CM | POA: Diagnosis not present

## 2021-02-18 DIAGNOSIS — M1250 Traumatic arthropathy, unspecified site: Secondary | ICD-10-CM | POA: Diagnosis not present

## 2021-02-18 DIAGNOSIS — Z1389 Encounter for screening for other disorder: Secondary | ICD-10-CM | POA: Diagnosis not present

## 2021-03-15 DIAGNOSIS — G629 Polyneuropathy, unspecified: Secondary | ICD-10-CM | POA: Diagnosis not present

## 2021-03-15 DIAGNOSIS — Z79891 Long term (current) use of opiate analgesic: Secondary | ICD-10-CM | POA: Diagnosis not present

## 2021-03-15 DIAGNOSIS — M1250 Traumatic arthropathy, unspecified site: Secondary | ICD-10-CM | POA: Diagnosis not present

## 2021-03-15 DIAGNOSIS — R69 Illness, unspecified: Secondary | ICD-10-CM | POA: Diagnosis not present

## 2021-03-15 DIAGNOSIS — Z1389 Encounter for screening for other disorder: Secondary | ICD-10-CM | POA: Diagnosis not present

## 2021-04-19 DIAGNOSIS — M1250 Traumatic arthropathy, unspecified site: Secondary | ICD-10-CM | POA: Diagnosis not present

## 2021-04-19 DIAGNOSIS — R69 Illness, unspecified: Secondary | ICD-10-CM | POA: Diagnosis not present

## 2021-04-19 DIAGNOSIS — G629 Polyneuropathy, unspecified: Secondary | ICD-10-CM | POA: Diagnosis not present

## 2021-04-19 DIAGNOSIS — Z1389 Encounter for screening for other disorder: Secondary | ICD-10-CM | POA: Diagnosis not present

## 2021-04-19 DIAGNOSIS — Z79891 Long term (current) use of opiate analgesic: Secondary | ICD-10-CM | POA: Diagnosis not present

## 2021-04-20 DIAGNOSIS — G6181 Chronic inflammatory demyelinating polyneuritis: Secondary | ICD-10-CM | POA: Diagnosis not present

## 2021-04-20 DIAGNOSIS — R200 Anesthesia of skin: Secondary | ICD-10-CM | POA: Diagnosis not present

## 2021-05-24 DIAGNOSIS — G629 Polyneuropathy, unspecified: Secondary | ICD-10-CM | POA: Diagnosis not present

## 2021-05-24 DIAGNOSIS — R69 Illness, unspecified: Secondary | ICD-10-CM | POA: Diagnosis not present

## 2021-05-24 DIAGNOSIS — Z79891 Long term (current) use of opiate analgesic: Secondary | ICD-10-CM | POA: Diagnosis not present

## 2021-05-24 DIAGNOSIS — Z1389 Encounter for screening for other disorder: Secondary | ICD-10-CM | POA: Diagnosis not present

## 2021-05-24 DIAGNOSIS — M1250 Traumatic arthropathy, unspecified site: Secondary | ICD-10-CM | POA: Diagnosis not present

## 2021-06-14 DIAGNOSIS — G4700 Insomnia, unspecified: Secondary | ICD-10-CM | POA: Diagnosis not present

## 2021-06-14 DIAGNOSIS — R7301 Impaired fasting glucose: Secondary | ICD-10-CM | POA: Diagnosis not present

## 2021-06-14 DIAGNOSIS — I10 Essential (primary) hypertension: Secondary | ICD-10-CM | POA: Diagnosis not present

## 2021-06-14 DIAGNOSIS — R69 Illness, unspecified: Secondary | ICD-10-CM | POA: Diagnosis not present

## 2021-06-14 DIAGNOSIS — E785 Hyperlipidemia, unspecified: Secondary | ICD-10-CM | POA: Diagnosis not present

## 2021-06-21 DIAGNOSIS — Z79891 Long term (current) use of opiate analgesic: Secondary | ICD-10-CM | POA: Diagnosis not present

## 2021-06-21 DIAGNOSIS — G629 Polyneuropathy, unspecified: Secondary | ICD-10-CM | POA: Diagnosis not present

## 2021-06-21 DIAGNOSIS — Z1389 Encounter for screening for other disorder: Secondary | ICD-10-CM | POA: Diagnosis not present

## 2021-06-21 DIAGNOSIS — M1250 Traumatic arthropathy, unspecified site: Secondary | ICD-10-CM | POA: Diagnosis not present

## 2021-06-21 DIAGNOSIS — R69 Illness, unspecified: Secondary | ICD-10-CM | POA: Diagnosis not present

## 2021-07-21 DIAGNOSIS — Z79891 Long term (current) use of opiate analgesic: Secondary | ICD-10-CM | POA: Diagnosis not present

## 2021-07-21 DIAGNOSIS — R69 Illness, unspecified: Secondary | ICD-10-CM | POA: Diagnosis not present

## 2021-07-21 DIAGNOSIS — M1250 Traumatic arthropathy, unspecified site: Secondary | ICD-10-CM | POA: Diagnosis not present

## 2021-07-21 DIAGNOSIS — Z1389 Encounter for screening for other disorder: Secondary | ICD-10-CM | POA: Diagnosis not present

## 2021-07-21 DIAGNOSIS — G629 Polyneuropathy, unspecified: Secondary | ICD-10-CM | POA: Diagnosis not present

## 2021-08-19 DIAGNOSIS — Z79891 Long term (current) use of opiate analgesic: Secondary | ICD-10-CM | POA: Diagnosis not present

## 2021-08-19 DIAGNOSIS — G894 Chronic pain syndrome: Secondary | ICD-10-CM | POA: Diagnosis not present

## 2021-08-19 DIAGNOSIS — F4310 Post-traumatic stress disorder, unspecified: Secondary | ICD-10-CM | POA: Diagnosis not present

## 2021-08-19 DIAGNOSIS — M1250 Traumatic arthropathy, unspecified site: Secondary | ICD-10-CM | POA: Diagnosis not present

## 2021-08-19 DIAGNOSIS — G629 Polyneuropathy, unspecified: Secondary | ICD-10-CM | POA: Diagnosis not present

## 2021-08-19 DIAGNOSIS — R69 Illness, unspecified: Secondary | ICD-10-CM | POA: Diagnosis not present

## 2021-08-19 DIAGNOSIS — Z1389 Encounter for screening for other disorder: Secondary | ICD-10-CM | POA: Diagnosis not present

## 2021-09-07 DIAGNOSIS — G8921 Chronic pain due to trauma: Secondary | ICD-10-CM | POA: Diagnosis not present

## 2021-09-07 DIAGNOSIS — R69 Illness, unspecified: Secondary | ICD-10-CM | POA: Diagnosis not present

## 2021-09-07 DIAGNOSIS — Z79891 Long term (current) use of opiate analgesic: Secondary | ICD-10-CM | POA: Diagnosis not present

## 2021-09-07 DIAGNOSIS — G6181 Chronic inflammatory demyelinating polyneuritis: Secondary | ICD-10-CM | POA: Diagnosis not present

## 2021-09-07 DIAGNOSIS — M62838 Other muscle spasm: Secondary | ICD-10-CM | POA: Diagnosis not present

## 2021-09-16 DIAGNOSIS — Z1389 Encounter for screening for other disorder: Secondary | ICD-10-CM | POA: Diagnosis not present

## 2021-09-16 DIAGNOSIS — G629 Polyneuropathy, unspecified: Secondary | ICD-10-CM | POA: Diagnosis not present

## 2021-09-16 DIAGNOSIS — Z79891 Long term (current) use of opiate analgesic: Secondary | ICD-10-CM | POA: Diagnosis not present

## 2021-09-16 DIAGNOSIS — R69 Illness, unspecified: Secondary | ICD-10-CM | POA: Diagnosis not present

## 2021-09-16 DIAGNOSIS — M1250 Traumatic arthropathy, unspecified site: Secondary | ICD-10-CM | POA: Diagnosis not present

## 2021-10-13 DIAGNOSIS — R69 Illness, unspecified: Secondary | ICD-10-CM | POA: Diagnosis not present

## 2021-10-13 DIAGNOSIS — M1250 Traumatic arthropathy, unspecified site: Secondary | ICD-10-CM | POA: Diagnosis not present

## 2021-10-13 DIAGNOSIS — Z1389 Encounter for screening for other disorder: Secondary | ICD-10-CM | POA: Diagnosis not present

## 2021-10-13 DIAGNOSIS — Z79891 Long term (current) use of opiate analgesic: Secondary | ICD-10-CM | POA: Diagnosis not present

## 2021-10-13 DIAGNOSIS — G629 Polyneuropathy, unspecified: Secondary | ICD-10-CM | POA: Diagnosis not present

## 2021-10-18 DIAGNOSIS — I951 Orthostatic hypotension: Secondary | ICD-10-CM | POA: Diagnosis not present

## 2021-10-18 DIAGNOSIS — Z6837 Body mass index (BMI) 37.0-37.9, adult: Secondary | ICD-10-CM | POA: Diagnosis not present

## 2021-10-18 DIAGNOSIS — E785 Hyperlipidemia, unspecified: Secondary | ICD-10-CM | POA: Diagnosis not present

## 2021-10-18 DIAGNOSIS — I10 Essential (primary) hypertension: Secondary | ICD-10-CM | POA: Diagnosis not present

## 2021-10-18 DIAGNOSIS — G6181 Chronic inflammatory demyelinating polyneuritis: Secondary | ICD-10-CM | POA: Diagnosis not present

## 2021-10-18 DIAGNOSIS — Z008 Encounter for other general examination: Secondary | ICD-10-CM | POA: Diagnosis not present

## 2021-10-18 DIAGNOSIS — M1990 Unspecified osteoarthritis, unspecified site: Secondary | ICD-10-CM | POA: Diagnosis not present

## 2021-10-18 DIAGNOSIS — I739 Peripheral vascular disease, unspecified: Secondary | ICD-10-CM | POA: Diagnosis not present

## 2021-10-18 DIAGNOSIS — R69 Illness, unspecified: Secondary | ICD-10-CM | POA: Diagnosis not present

## 2021-10-18 DIAGNOSIS — I25119 Atherosclerotic heart disease of native coronary artery with unspecified angina pectoris: Secondary | ICD-10-CM | POA: Diagnosis not present

## 2021-10-18 DIAGNOSIS — K5900 Constipation, unspecified: Secondary | ICD-10-CM | POA: Diagnosis not present

## 2021-11-11 DIAGNOSIS — Z1389 Encounter for screening for other disorder: Secondary | ICD-10-CM | POA: Diagnosis not present

## 2021-11-11 DIAGNOSIS — G629 Polyneuropathy, unspecified: Secondary | ICD-10-CM | POA: Diagnosis not present

## 2021-11-11 DIAGNOSIS — M1250 Traumatic arthropathy, unspecified site: Secondary | ICD-10-CM | POA: Diagnosis not present

## 2021-11-11 DIAGNOSIS — R69 Illness, unspecified: Secondary | ICD-10-CM | POA: Diagnosis not present

## 2021-11-11 DIAGNOSIS — Z79891 Long term (current) use of opiate analgesic: Secondary | ICD-10-CM | POA: Diagnosis not present

## 2021-12-02 LAB — HM DIABETES EYE EXAM

## 2021-12-14 DIAGNOSIS — G4700 Insomnia, unspecified: Secondary | ICD-10-CM | POA: Diagnosis not present

## 2021-12-14 DIAGNOSIS — I10 Essential (primary) hypertension: Secondary | ICD-10-CM | POA: Diagnosis not present

## 2021-12-14 DIAGNOSIS — R7301 Impaired fasting glucose: Secondary | ICD-10-CM | POA: Diagnosis not present

## 2021-12-14 DIAGNOSIS — E785 Hyperlipidemia, unspecified: Secondary | ICD-10-CM | POA: Diagnosis not present

## 2021-12-14 DIAGNOSIS — Z1331 Encounter for screening for depression: Secondary | ICD-10-CM | POA: Diagnosis not present

## 2021-12-14 DIAGNOSIS — Z6839 Body mass index (BMI) 39.0-39.9, adult: Secondary | ICD-10-CM | POA: Diagnosis not present

## 2021-12-15 DIAGNOSIS — Z1389 Encounter for screening for other disorder: Secondary | ICD-10-CM | POA: Diagnosis not present

## 2021-12-15 DIAGNOSIS — F4310 Post-traumatic stress disorder, unspecified: Secondary | ICD-10-CM | POA: Diagnosis not present

## 2021-12-15 DIAGNOSIS — G629 Polyneuropathy, unspecified: Secondary | ICD-10-CM | POA: Diagnosis not present

## 2021-12-15 DIAGNOSIS — Z79891 Long term (current) use of opiate analgesic: Secondary | ICD-10-CM | POA: Diagnosis not present

## 2021-12-15 DIAGNOSIS — M1250 Traumatic arthropathy, unspecified site: Secondary | ICD-10-CM | POA: Diagnosis not present

## 2021-12-15 DIAGNOSIS — R69 Illness, unspecified: Secondary | ICD-10-CM | POA: Diagnosis not present

## 2022-01-13 DIAGNOSIS — Z1389 Encounter for screening for other disorder: Secondary | ICD-10-CM | POA: Diagnosis not present

## 2022-01-13 DIAGNOSIS — R69 Illness, unspecified: Secondary | ICD-10-CM | POA: Diagnosis not present

## 2022-01-13 DIAGNOSIS — G629 Polyneuropathy, unspecified: Secondary | ICD-10-CM | POA: Diagnosis not present

## 2022-01-13 DIAGNOSIS — M1250 Traumatic arthropathy, unspecified site: Secondary | ICD-10-CM | POA: Diagnosis not present

## 2022-01-13 DIAGNOSIS — Z79891 Long term (current) use of opiate analgesic: Secondary | ICD-10-CM | POA: Diagnosis not present

## 2022-01-18 DIAGNOSIS — M25561 Pain in right knee: Secondary | ICD-10-CM | POA: Diagnosis not present

## 2022-01-18 DIAGNOSIS — M238X1 Other internal derangements of right knee: Secondary | ICD-10-CM | POA: Diagnosis not present

## 2022-01-18 DIAGNOSIS — M1731 Unilateral post-traumatic osteoarthritis, right knee: Secondary | ICD-10-CM | POA: Diagnosis not present

## 2022-01-26 DIAGNOSIS — M1731 Unilateral post-traumatic osteoarthritis, right knee: Secondary | ICD-10-CM | POA: Diagnosis not present

## 2022-02-22 DIAGNOSIS — Z1389 Encounter for screening for other disorder: Secondary | ICD-10-CM | POA: Diagnosis not present

## 2022-02-22 DIAGNOSIS — G894 Chronic pain syndrome: Secondary | ICD-10-CM | POA: Diagnosis not present

## 2022-02-22 DIAGNOSIS — M1250 Traumatic arthropathy, unspecified site: Secondary | ICD-10-CM | POA: Diagnosis not present

## 2022-02-22 DIAGNOSIS — Z79891 Long term (current) use of opiate analgesic: Secondary | ICD-10-CM | POA: Diagnosis not present

## 2022-02-22 DIAGNOSIS — R69 Illness, unspecified: Secondary | ICD-10-CM | POA: Diagnosis not present

## 2022-02-22 DIAGNOSIS — F4310 Post-traumatic stress disorder, unspecified: Secondary | ICD-10-CM | POA: Diagnosis not present

## 2022-02-22 DIAGNOSIS — G629 Polyneuropathy, unspecified: Secondary | ICD-10-CM | POA: Diagnosis not present

## 2022-03-24 DIAGNOSIS — Z1389 Encounter for screening for other disorder: Secondary | ICD-10-CM | POA: Diagnosis not present

## 2022-03-24 DIAGNOSIS — Z79891 Long term (current) use of opiate analgesic: Secondary | ICD-10-CM | POA: Diagnosis not present

## 2022-03-24 DIAGNOSIS — R69 Illness, unspecified: Secondary | ICD-10-CM | POA: Diagnosis not present

## 2022-03-24 DIAGNOSIS — G629 Polyneuropathy, unspecified: Secondary | ICD-10-CM | POA: Diagnosis not present

## 2022-03-24 DIAGNOSIS — M1250 Traumatic arthropathy, unspecified site: Secondary | ICD-10-CM | POA: Diagnosis not present

## 2022-04-24 DIAGNOSIS — Z79891 Long term (current) use of opiate analgesic: Secondary | ICD-10-CM | POA: Diagnosis not present

## 2022-04-24 DIAGNOSIS — M1250 Traumatic arthropathy, unspecified site: Secondary | ICD-10-CM | POA: Diagnosis not present

## 2022-04-24 DIAGNOSIS — G629 Polyneuropathy, unspecified: Secondary | ICD-10-CM | POA: Diagnosis not present

## 2022-04-24 DIAGNOSIS — R69 Illness, unspecified: Secondary | ICD-10-CM | POA: Diagnosis not present

## 2022-04-24 DIAGNOSIS — Z1389 Encounter for screening for other disorder: Secondary | ICD-10-CM | POA: Diagnosis not present

## 2022-05-23 DIAGNOSIS — G629 Polyneuropathy, unspecified: Secondary | ICD-10-CM | POA: Diagnosis not present

## 2022-05-23 DIAGNOSIS — Z1389 Encounter for screening for other disorder: Secondary | ICD-10-CM | POA: Diagnosis not present

## 2022-05-23 DIAGNOSIS — M1250 Traumatic arthropathy, unspecified site: Secondary | ICD-10-CM | POA: Diagnosis not present

## 2022-05-23 DIAGNOSIS — Z79891 Long term (current) use of opiate analgesic: Secondary | ICD-10-CM | POA: Diagnosis not present

## 2022-05-23 DIAGNOSIS — R69 Illness, unspecified: Secondary | ICD-10-CM | POA: Diagnosis not present

## 2022-06-01 ENCOUNTER — Ambulatory Visit (INDEPENDENT_AMBULATORY_CARE_PROVIDER_SITE_OTHER): Payer: Medicare HMO | Admitting: Physician Assistant

## 2022-06-01 ENCOUNTER — Encounter: Payer: Self-pay | Admitting: Physician Assistant

## 2022-06-01 VITALS — BP 128/72 | HR 98 | Temp 97.9°F | Ht 74.0 in | Wt 290.0 lb

## 2022-06-01 DIAGNOSIS — G8921 Chronic pain due to trauma: Secondary | ICD-10-CM

## 2022-06-01 DIAGNOSIS — Z125 Encounter for screening for malignant neoplasm of prostate: Secondary | ICD-10-CM

## 2022-06-01 DIAGNOSIS — R69 Illness, unspecified: Secondary | ICD-10-CM | POA: Diagnosis not present

## 2022-06-01 DIAGNOSIS — E782 Mixed hyperlipidemia: Secondary | ICD-10-CM | POA: Insufficient documentation

## 2022-06-01 DIAGNOSIS — F4310 Post-traumatic stress disorder, unspecified: Secondary | ICD-10-CM

## 2022-06-01 DIAGNOSIS — I10 Essential (primary) hypertension: Secondary | ICD-10-CM | POA: Diagnosis not present
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
## 2022-06-06 ENCOUNTER — Other Ambulatory Visit: Payer: Medicare HMO

## 2022-06-06 DIAGNOSIS — Z125 Encounter for screening for malignant neoplasm of prostate: Secondary | ICD-10-CM

## 2022-06-06 DIAGNOSIS — E782 Mixed hyperlipidemia: Secondary | ICD-10-CM | POA: Diagnosis not present

## 2022-06-06 DIAGNOSIS — I10 Essential (primary) hypertension: Secondary | ICD-10-CM | POA: Diagnosis not present

## 2022-06-07 LAB — CBC WITH DIFFERENTIAL/PLATELET
Basophils Absolute: 0.1 10*3/uL (ref 0.0–0.2)
Basos: 1 %
EOS (ABSOLUTE): 0.1 10*3/uL (ref 0.0–0.4)
Eos: 2 %
Hematocrit: 44.4 % (ref 37.5–51.0)
Hemoglobin: 15.3 g/dL (ref 13.0–17.7)
Immature Grans (Abs): 0 10*3/uL (ref 0.0–0.1)
Immature Granulocytes: 0 %
Lymphocytes Absolute: 2.1 10*3/uL (ref 0.7–3.1)
Lymphs: 27 %
MCH: 30.8 pg (ref 26.6–33.0)
MCHC: 34.5 g/dL (ref 31.5–35.7)
MCV: 90 fL (ref 79–97)
Monocytes Absolute: 0.7 10*3/uL (ref 0.1–0.9)
Monocytes: 9 %
Neutrophils Absolute: 4.8 10*3/uL (ref 1.4–7.0)
Neutrophils: 61 %
Platelets: 264 10*3/uL (ref 150–450)
RBC: 4.96 x10E6/uL (ref 4.14–5.80)
RDW: 12.7 % (ref 11.6–15.4)
WBC: 7.9 10*3/uL (ref 3.4–10.8)

## 2022-06-07 LAB — LIPID PANEL
Chol/HDL Ratio: 3.1 ratio (ref 0.0–5.0)
Cholesterol, Total: 146 mg/dL (ref 100–199)
HDL: 47 mg/dL (ref >39)
LDL Chol Calc (NIH): 81 mg/dL (ref 0–99)
Triglycerides: 94 mg/dL (ref 0–149)
VLDL Cholesterol Cal: 18 mg/dL (ref 5–40)

## 2022-06-07 LAB — COMPREHENSIVE METABOLIC PANEL
ALT: 28 IU/L (ref 0–44)
AST: 26 IU/L (ref 0–40)
Albumin/Globulin Ratio: 1.2 (ref 1.2–2.2)
Albumin: 4.2 g/dL (ref 3.8–4.9)
Alkaline Phosphatase: 65 IU/L (ref 44–121)
BUN/Creatinine Ratio: 11 (ref 9–20)
BUN: 12 mg/dL (ref 6–24)
Bilirubin Total: 0.3 mg/dL (ref 0.0–1.2)
CO2: 22 mmol/L (ref 20–29)
Calcium: 9.8 mg/dL (ref 8.7–10.2)
Chloride: 101 mmol/L (ref 96–106)
Creatinine, Ser: 1.11 mg/dL (ref 0.76–1.27)
Globulin, Total: 3.5 g/dL (ref 1.5–4.5)
Glucose: 109 mg/dL — ABNORMAL HIGH (ref 70–99)
Potassium: 4.2 mmol/L (ref 3.5–5.2)
Sodium: 138 mmol/L (ref 134–144)
Total Protein: 7.7 g/dL (ref 6.0–8.5)
eGFR: 80 mL/min/{1.73_m2} (ref >59)

## 2022-06-07 LAB — TSH: TSH: 1.68 u[IU]/mL (ref 0.450–4.500)

## 2022-06-07 LAB — CARDIOVASCULAR RISK ASSESSMENT

## 2022-06-07 LAB — PSA: Prostate Specific Ag, Serum: 0.4 ng/mL (ref 0.0–4.0)

## 2022-06-14 ENCOUNTER — Ambulatory Visit: Payer: Medicare HMO

## 2022-06-20 DIAGNOSIS — M1250 Traumatic arthropathy, unspecified site: Secondary | ICD-10-CM | POA: Diagnosis not present

## 2022-06-20 DIAGNOSIS — Z79891 Long term (current) use of opiate analgesic: Secondary | ICD-10-CM | POA: Diagnosis not present

## 2022-06-20 DIAGNOSIS — G629 Polyneuropathy, unspecified: Secondary | ICD-10-CM | POA: Diagnosis not present

## 2022-06-20 DIAGNOSIS — R69 Illness, unspecified: Secondary | ICD-10-CM | POA: Diagnosis not present

## 2022-06-20 DIAGNOSIS — Z1389 Encounter for screening for other disorder: Secondary | ICD-10-CM | POA: Diagnosis not present

## 2022-06-22 DIAGNOSIS — M1250 Traumatic arthropathy, unspecified site: Secondary | ICD-10-CM | POA: Diagnosis not present

## 2022-06-22 DIAGNOSIS — M160 Bilateral primary osteoarthritis of hip: Secondary | ICD-10-CM | POA: Diagnosis not present

## 2022-06-23 ENCOUNTER — Encounter: Payer: Self-pay | Admitting: Physician Assistant

## 2022-06-23 NOTE — Telephone Encounter (Signed)
Patient made aware, verbalized understanding. She will call pharmacy to see if they they have saxenda and pharmacy has been called to cancel 88 mcg

## 2022-06-27 ENCOUNTER — Ambulatory Visit (INDEPENDENT_AMBULATORY_CARE_PROVIDER_SITE_OTHER): Payer: Medicare HMO

## 2022-06-27 ENCOUNTER — Other Ambulatory Visit: Payer: Self-pay

## 2022-06-27 DIAGNOSIS — Z1211 Encounter for screening for malignant neoplasm of colon: Secondary | ICD-10-CM

## 2022-06-27 DIAGNOSIS — Z0000 Encounter for general adult medical examination without abnormal findings: Secondary | ICD-10-CM

## 2022-06-27 MED ORDER — LOSARTAN POTASSIUM-HCTZ 100-25 MG PO TABS: 1.0000 | ORAL_TABLET | Freq: Every day | ORAL | 1 refills | Status: DC
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
## 2022-06-27 NOTE — Patient Instructions (Signed)
Aaron Holland , Thank you for taking time to come for your Medicare Wellness Visit. I appreciate your ongoing commitment to your health goals. Please review the following plan we discussed and let me know if I can assist you in the future.   Screening recommendations/referrals: Colonoscopy: Cologuard ordered Recommended yearly ophthalmology/optometry visit for glaucoma screening and checkup Recommended yearly dental visit for hygiene and checkup  Vaccinations: Influenza vaccine: Declined Pneumococcal vaccine: Declined Tdap vaccine: Declined Shingles vaccine: Declined     Preventive Care 40-64 Years, Male Preventive care refers to lifestyle choices and visits with your health care provider that can promote health and wellness. What does preventive care include? A yearly physical exam. This is also called an annual well check. Dental exams once or twice a year. Routine eye exams. Ask your health care provider how often you should have your eyes checked. Personal lifestyle choices, including: Daily care of your teeth and gums. Regular physical activity. Eating a healthy diet. Avoiding tobacco and drug use. Limiting alcohol use. Practicing safe sex. Taking low-dose aspirin every day starting at age 10. What happens during an annual well check? The services and screenings done by your health care provider during your annual well check will depend on your age, overall health, lifestyle risk factors, and family history of disease. Counseling  Your health care provider may ask you questions about your: Alcohol use. Tobacco use. Drug use. Emotional well-being. Home and relationship well-being. Sexual activity. Eating habits. Work and work Astronomer. Screening  You may have the following tests or measurements: Height, weight, and BMI. Blood pressure. Lipid and cholesterol levels. These may be checked every 5 years, or more frequently if you are over 73 years old. Skin  check. Lung cancer screening. You may have this screening every year starting at age 83 if you have a 30-pack-year history of smoking and currently smoke or have quit within the past 15 years. Fecal occult blood test (FOBT) of the stool. You may have this test every year starting at age 13. Flexible sigmoidoscopy or colonoscopy. You may have a sigmoidoscopy every 5 years or a colonoscopy every 10 years starting at age 36. Prostate cancer screening. Recommendations will vary depending on your family history and other risks. Hepatitis C blood test. Hepatitis B blood test. Sexually transmitted disease (STD) testing. Diabetes screening. This is done by checking your blood sugar (glucose) after you have not eaten for a while (fasting). You may have this done every 1-3 years. Discuss your test results, treatment options, and if necessary, the need for more tests with your health care provider. Vaccines  Your health care provider may recommend certain vaccines, such as: Influenza vaccine. This is recommended every year. Tetanus, diphtheria, and acellular pertussis (Tdap, Td) vaccine. You may need a Td booster every 10 years. Zoster vaccine. You may need this after age 73. Pneumococcal 13-valent conjugate (PCV13) vaccine. You may need this if you have certain conditions and have not been vaccinated. Pneumococcal polysaccharide (PPSV23) vaccine. You may need one or two doses if you smoke cigarettes or if you have certain conditions. Talk to your health care provider about which screenings and vaccines you need and how often you need them. This information is not intended to replace advice given to you by your health care provider. Make sure you discuss any questions you have with your health care provider. Document Released: 07/30/2015 Document Revised: 03/22/2016 Document Reviewed: 05/04/2015 Elsevier Interactive Patient Education  2017 [employer].  Fall Prevention in the Home  Falls can cause  injuries. They can happen to people of all ages. There are many things you can do to make your home safe and to help prevent falls. What can I do on the outside of my home? Regularly fix the edges of walkways and driveways and fix any cracks. Remove anything that might make you trip as you walk through a door, such as a raised step or threshold. Trim any bushes or trees on the path to your home. Use bright outdoor lighting. Clear any walking paths of anything that might make someone trip, such as rocks or tools. Regularly check to see if handrails are loose or broken. Make sure that both sides of any steps have handrails. Any raised decks and porches should have guardrails on the edges. Have any leaves, snow, or ice cleared regularly. Use sand or salt on walking paths during winter. Clean up any spills in your garage right away. This includes oil or grease spills. What can I do in the bathroom? Use night lights. Install grab bars by the toilet and in the tub and shower. Do not use towel bars as grab bars. Use non-skid mats or decals in the tub or shower. If you need to sit down in the shower, use a plastic, non-slip stool. Keep the floor dry. Clean up any water that spills on the floor as soon as it happens. Remove soap buildup in the tub or shower regularly. Attach bath mats securely with double-sided non-slip rug tape. Do not have throw rugs and other things on the floor that can make you trip. What can I do in the bedroom? Use night lights. Make sure that you have a light by your bed that is easy to reach. Do not use any sheets or blankets that are too big for your bed. They should not hang down onto the floor. Have a firm chair that has side arms. You can use this for support while you get dressed. Do not have throw rugs and other things on the floor that can make you trip. What can I do in the kitchen? Clean up any spills right away. Avoid walking on wet floors. Keep items that you  use a lot in easy-to-reach places. If you need to reach something above you, use a strong step stool that has a grab bar. Keep electrical cords out of the way. Do not use floor polish or wax that makes floors slippery. If you must use wax, use non-skid floor wax. Do not have throw rugs and other things on the floor that can make you trip. What can I do with my stairs? Do not leave any items on the stairs. Make sure that there are handrails on both sides of the stairs and use them. Fix handrails that are broken or loose. Make sure that handrails are as long as the stairways. Check any carpeting to make sure that it is firmly attached to the stairs. Fix any carpet that is loose or worn. Avoid having throw rugs at the top or bottom of the stairs. If you do have throw rugs, attach them to the floor with carpet tape. Make sure that you have a light switch at the top of the stairs and the bottom of the stairs. If you do not have them, ask someone to add them for you. What else can I do to help prevent falls? Wear shoes that: Do not have high heels. Have rubber bottoms. Are comfortable and fit you well. Are closed at  the toe. Do not wear sandals. If you use a stepladder: Make sure that it is fully opened. Do not climb a closed stepladder. Make sure that both sides of the stepladder are locked into place. Ask someone to hold it for you, if possible. Clearly mark and make sure that you can see: Any grab bars or handrails. First and last steps. Where the edge of each step is. Use tools that help you move around (mobility aids) if they are needed. These include: Canes. Walkers. Scooters. Crutches. Turn on the lights when you go into a dark area. Replace any light bulbs as soon as they burn out. Set up your furniture so you have a clear path. Avoid moving your furniture around. If any of your floors are uneven, fix them. If there are any pets around you, be aware of where they are. Review your  medicines with your doctor. Some medicines can make you feel dizzy. This can increase your chance of falling. Ask your doctor what other things that you can do to help prevent falls. This information is not intended to replace advice given to you by your health care provider. Make sure you discuss any questions you have with your health care provider. Document Released: 04/29/2009 Document Revised: 12/09/2015 Document Reviewed: 08/07/2014 Elsevier Interactive Patient Education  2017 [employer].

## 2022-07-18 DIAGNOSIS — Z1211 Encounter for screening for malignant neoplasm of colon: Secondary | ICD-10-CM | POA: Diagnosis not present

## 2022-07-24 DIAGNOSIS — M1250 Traumatic arthropathy, unspecified site: Secondary | ICD-10-CM | POA: Diagnosis not present

## 2022-07-24 DIAGNOSIS — R69 Illness, unspecified: Secondary | ICD-10-CM | POA: Diagnosis not present

## 2022-07-24 DIAGNOSIS — Z79891 Long term (current) use of opiate analgesic: Secondary | ICD-10-CM | POA: Diagnosis not present

## 2022-07-24 DIAGNOSIS — Z1389 Encounter for screening for other disorder: Secondary | ICD-10-CM | POA: Diagnosis not present

## 2022-07-24 DIAGNOSIS — G629 Polyneuropathy, unspecified: Secondary | ICD-10-CM | POA: Diagnosis not present

## 2022-07-24 DIAGNOSIS — M169 Osteoarthritis of hip, unspecified: Secondary | ICD-10-CM | POA: Diagnosis not present

## 2022-07-25 LAB — COLOGUARD: COLOGUARD: NEGATIVE

## 2022-07-30 ENCOUNTER — Encounter: Payer: Self-pay | Admitting: Physician Assistant

## 2022-07-31 ENCOUNTER — Other Ambulatory Visit: Payer: Self-pay

## 2022-07-31 ENCOUNTER — Other Ambulatory Visit: Payer: Self-pay | Admitting: Physician Assistant

## 2022-07-31 MED ORDER — AMLODIPINE BESYLATE 10 MG PO TABS: 10.0000 mg | ORAL_TABLET | Freq: Every day | ORAL | 0 refills | Status: DC

## 2022-08-15 ENCOUNTER — Other Ambulatory Visit: Payer: Self-pay

## 2022-08-15 ENCOUNTER — Encounter: Payer: Self-pay | Admitting: Physician Assistant

## 2022-08-15 MED ORDER — MELOXICAM 15 MG PO TABS: 15.0000 mg | ORAL_TABLET | Freq: Every day | ORAL | 0 refills | Status: DC

## 2022-08-15 MED ORDER — ROSUVASTATIN CALCIUM 10 MG PO TABS: 10.0000 mg | ORAL_TABLET | Freq: Every day | ORAL | 0 refills | Status: DC

## 2022-08-22 DIAGNOSIS — M1250 Traumatic arthropathy, unspecified site: Secondary | ICD-10-CM | POA: Diagnosis not present

## 2022-08-22 DIAGNOSIS — G894 Chronic pain syndrome: Secondary | ICD-10-CM | POA: Diagnosis not present

## 2022-08-22 DIAGNOSIS — G629 Polyneuropathy, unspecified: Secondary | ICD-10-CM | POA: Diagnosis not present

## 2022-08-22 DIAGNOSIS — M169 Osteoarthritis of hip, unspecified: Secondary | ICD-10-CM | POA: Diagnosis not present

## 2022-08-22 DIAGNOSIS — Z1389 Encounter for screening for other disorder: Secondary | ICD-10-CM | POA: Diagnosis not present

## 2022-08-22 DIAGNOSIS — Z79891 Long term (current) use of opiate analgesic: Secondary | ICD-10-CM | POA: Diagnosis not present

## 2022-08-22 DIAGNOSIS — F4310 Post-traumatic stress disorder, unspecified: Secondary | ICD-10-CM | POA: Diagnosis not present

## 2022-08-22 DIAGNOSIS — R69 Illness, unspecified: Secondary | ICD-10-CM | POA: Diagnosis not present

## 2022-08-24 DIAGNOSIS — Z6835 Body mass index (BMI) 35.0-35.9, adult: Secondary | ICD-10-CM | POA: Diagnosis not present

## 2022-08-24 DIAGNOSIS — Z96649 Presence of unspecified artificial hip joint: Secondary | ICD-10-CM | POA: Diagnosis not present

## 2022-08-24 DIAGNOSIS — E785 Hyperlipidemia, unspecified: Secondary | ICD-10-CM | POA: Diagnosis not present

## 2022-08-24 DIAGNOSIS — Z791 Long term (current) use of non-steroidal anti-inflammatories (NSAID): Secondary | ICD-10-CM | POA: Diagnosis not present

## 2022-08-24 DIAGNOSIS — R69 Illness, unspecified: Secondary | ICD-10-CM | POA: Diagnosis not present

## 2022-08-24 DIAGNOSIS — I739 Peripheral vascular disease, unspecified: Secondary | ICD-10-CM | POA: Diagnosis not present

## 2022-08-24 DIAGNOSIS — Z9181 History of falling: Secondary | ICD-10-CM | POA: Diagnosis not present

## 2022-08-24 DIAGNOSIS — Z79891 Long term (current) use of opiate analgesic: Secondary | ICD-10-CM | POA: Diagnosis not present

## 2022-08-24 DIAGNOSIS — G6181 Chronic inflammatory demyelinating polyneuritis: Secondary | ICD-10-CM | POA: Diagnosis not present

## 2022-08-24 DIAGNOSIS — I10 Essential (primary) hypertension: Secondary | ICD-10-CM | POA: Diagnosis not present

## 2022-08-24 DIAGNOSIS — M1990 Unspecified osteoarthritis, unspecified site: Secondary | ICD-10-CM | POA: Diagnosis not present

## 2022-09-18 DIAGNOSIS — M1250 Traumatic arthropathy, unspecified site: Secondary | ICD-10-CM | POA: Diagnosis not present

## 2022-09-18 DIAGNOSIS — G629 Polyneuropathy, unspecified: Secondary | ICD-10-CM | POA: Diagnosis not present

## 2022-09-18 DIAGNOSIS — M169 Osteoarthritis of hip, unspecified: Secondary | ICD-10-CM | POA: Diagnosis not present

## 2022-09-18 DIAGNOSIS — Z1389 Encounter for screening for other disorder: Secondary | ICD-10-CM | POA: Diagnosis not present

## 2022-09-18 DIAGNOSIS — Z79891 Long term (current) use of opiate analgesic: Secondary | ICD-10-CM | POA: Diagnosis not present

## 2022-09-18 DIAGNOSIS — R69 Illness, unspecified: Secondary | ICD-10-CM | POA: Diagnosis not present

## 2022-10-11 ENCOUNTER — Encounter: Payer: Self-pay | Admitting: Physician Assistant

## 2022-10-11 ENCOUNTER — Ambulatory Visit (INDEPENDENT_AMBULATORY_CARE_PROVIDER_SITE_OTHER): Payer: Medicare HMO | Admitting: Physician Assistant

## 2022-10-11 VITALS — BP 124/86 | HR 81 | Temp 97.2°F | Ht 74.0 in | Wt 290.8 lb

## 2022-10-11 DIAGNOSIS — R739 Hyperglycemia, unspecified: Secondary | ICD-10-CM | POA: Diagnosis not present

## 2022-10-11 DIAGNOSIS — E782 Mixed hyperlipidemia: Secondary | ICD-10-CM

## 2022-10-11 DIAGNOSIS — G8921 Chronic pain due to trauma: Secondary | ICD-10-CM

## 2022-10-11 DIAGNOSIS — I10 Essential (primary) hypertension: Secondary | ICD-10-CM
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
## 2022-10-12 LAB — CBC WITH DIFFERENTIAL/PLATELET
Basophils Absolute: 0.1 10*3/uL (ref 0.0–0.2)
Basos: 1 %
EOS (ABSOLUTE): 0.2 10*3/uL (ref 0.0–0.4)
Eos: 2 %
Hematocrit: 45 % (ref 37.5–51.0)
Hemoglobin: 15.1 g/dL (ref 13.0–17.7)
Immature Grans (Abs): 0 10*3/uL (ref 0.0–0.1)
Immature Granulocytes: 0 %
Lymphocytes Absolute: 2.1 10*3/uL (ref 0.7–3.1)
Lymphs: 26 %
MCH: 30.1 pg (ref 26.6–33.0)
MCHC: 33.6 g/dL (ref 31.5–35.7)
MCV: 90 fL (ref 79–97)
Monocytes Absolute: 0.7 10*3/uL (ref 0.1–0.9)
Monocytes: 8 %
Neutrophils Absolute: 4.9 10*3/uL (ref 1.4–7.0)
Neutrophils: 63 %
Platelets: 279 10*3/uL (ref 150–450)
RBC: 5.01 x10E6/uL (ref 4.14–5.80)
RDW: 12.5 % (ref 11.6–15.4)
WBC: 7.8 10*3/uL (ref 3.4–10.8)

## 2022-10-12 LAB — LIPID PANEL
Chol/HDL Ratio: 2.9 ratio (ref 0.0–5.0)
Cholesterol, Total: 149 mg/dL (ref 100–199)
HDL: 51 mg/dL (ref >39)
LDL Chol Calc (NIH): 78 mg/dL (ref 0–99)
Triglycerides: 110 mg/dL (ref 0–149)
VLDL Cholesterol Cal: 20 mg/dL (ref 5–40)

## 2022-10-12 LAB — COMPREHENSIVE METABOLIC PANEL
ALT: 35 IU/L (ref 0–44)
AST: 28 IU/L (ref 0–40)
Albumin/Globulin Ratio: 1.2 (ref 1.2–2.2)
Albumin: 4.3 g/dL (ref 3.8–4.9)
Alkaline Phosphatase: 76 IU/L (ref 44–121)
BUN/Creatinine Ratio: 13 (ref 9–20)
BUN: 14 mg/dL (ref 6–24)
Bilirubin Total: 0.5 mg/dL (ref 0.0–1.2)
CO2: 23 mmol/L (ref 20–29)
Calcium: 9.8 mg/dL (ref 8.7–10.2)
Chloride: 100 mmol/L (ref 96–106)
Creatinine, Ser: 1.06 mg/dL (ref 0.76–1.27)
Globulin, Total: 3.5 g/dL (ref 1.5–4.5)
Glucose: 125 mg/dL — ABNORMAL HIGH (ref 70–99)
Potassium: 4.3 mmol/L (ref 3.5–5.2)
Sodium: 140 mmol/L (ref 134–144)
Total Protein: 7.8 g/dL (ref 6.0–8.5)
eGFR: 84 mL/min/{1.73_m2} (ref >59)

## 2022-10-12 LAB — CARDIOVASCULAR RISK ASSESSMENT

## 2022-10-12 LAB — HEMOGLOBIN A1C
Est. average glucose Bld gHb Est-mCnc: 140 mg/dL
Hgb A1c MFr Bld: 6.5 % — ABNORMAL HIGH (ref 4.8–5.6)

## 2022-10-16 ENCOUNTER — Other Ambulatory Visit: Payer: Self-pay | Admitting: Physician Assistant

## 2022-10-16 ENCOUNTER — Encounter: Payer: Self-pay | Admitting: Physician Assistant

## 2022-10-16 DIAGNOSIS — E119 Type 2 diabetes mellitus without complications: Secondary | ICD-10-CM

## 2022-10-16 MED ORDER — METFORMIN HCL 500 MG PO TABS: 500.0000 mg | ORAL_TABLET | Freq: Every day | ORAL | 0 refills | Status: DC

## 2022-10-17 DIAGNOSIS — M1250 Traumatic arthropathy, unspecified site: Secondary | ICD-10-CM | POA: Diagnosis not present

## 2022-10-17 DIAGNOSIS — G629 Polyneuropathy, unspecified: Secondary | ICD-10-CM | POA: Diagnosis not present

## 2022-10-17 DIAGNOSIS — Z79891 Long term (current) use of opiate analgesic: Secondary | ICD-10-CM | POA: Diagnosis not present

## 2022-10-17 DIAGNOSIS — M169 Osteoarthritis of hip, unspecified: Secondary | ICD-10-CM | POA: Diagnosis not present

## 2022-10-17 DIAGNOSIS — Z1389 Encounter for screening for other disorder: Secondary | ICD-10-CM | POA: Diagnosis not present

## 2022-10-17 DIAGNOSIS — R69 Illness, unspecified: Secondary | ICD-10-CM | POA: Diagnosis not present

## 2022-10-25 ENCOUNTER — Other Ambulatory Visit: Payer: Self-pay | Admitting: Physician Assistant

## 2022-11-01 ENCOUNTER — Encounter: Payer: Self-pay | Admitting: Physician Assistant

## 2022-11-02 ENCOUNTER — Other Ambulatory Visit: Payer: Self-pay

## 2022-11-10 ENCOUNTER — Other Ambulatory Visit: Payer: Self-pay | Admitting: Physician Assistant

## 2022-11-12 ENCOUNTER — Other Ambulatory Visit: Payer: Self-pay | Admitting: Physician Assistant

## 2022-11-13 MED ORDER — MELOXICAM 15 MG PO TABS: 15.0000 mg | ORAL_TABLET | Freq: Every day | ORAL | 0 refills | Status: DC

## 2022-11-14 DIAGNOSIS — M169 Osteoarthritis of hip, unspecified: Secondary | ICD-10-CM | POA: Diagnosis not present

## 2022-11-14 DIAGNOSIS — F4310 Post-traumatic stress disorder, unspecified: Secondary | ICD-10-CM | POA: Diagnosis not present

## 2022-11-14 DIAGNOSIS — Z79891 Long term (current) use of opiate analgesic: Secondary | ICD-10-CM | POA: Diagnosis not present

## 2022-11-14 DIAGNOSIS — Z1389 Encounter for screening for other disorder: Secondary | ICD-10-CM | POA: Diagnosis not present

## 2022-11-14 DIAGNOSIS — M1250 Traumatic arthropathy, unspecified site: Secondary | ICD-10-CM | POA: Diagnosis not present

## 2022-11-14 DIAGNOSIS — G629 Polyneuropathy, unspecified: Secondary | ICD-10-CM | POA: Diagnosis not present

## 2022-12-13 DIAGNOSIS — Z1389 Encounter for screening for other disorder: Secondary | ICD-10-CM | POA: Diagnosis not present

## 2022-12-13 DIAGNOSIS — M1250 Traumatic arthropathy, unspecified site: Secondary | ICD-10-CM | POA: Diagnosis not present

## 2022-12-13 DIAGNOSIS — G629 Polyneuropathy, unspecified: Secondary | ICD-10-CM | POA: Diagnosis not present

## 2022-12-13 DIAGNOSIS — Z79891 Long term (current) use of opiate analgesic: Secondary | ICD-10-CM | POA: Diagnosis not present

## 2022-12-13 DIAGNOSIS — M169 Osteoarthritis of hip, unspecified: Secondary | ICD-10-CM | POA: Diagnosis not present

## 2022-12-13 DIAGNOSIS — F4310 Post-traumatic stress disorder, unspecified: Secondary | ICD-10-CM | POA: Diagnosis not present

## 2022-12-29 ENCOUNTER — Other Ambulatory Visit: Payer: Self-pay | Admitting: Physician Assistant

## 2022-12-29 DIAGNOSIS — E119 Type 2 diabetes mellitus without complications: Secondary | ICD-10-CM

## 2022-12-31 MED ORDER — METFORMIN HCL 500 MG PO TABS
500.0000 mg | ORAL_TABLET | Freq: Every day | ORAL | 0 refills | Status: DC
Start: 2022-12-31 — End: 2023-04-02

## 2023-01-11 DIAGNOSIS — Z79891 Long term (current) use of opiate analgesic: Secondary | ICD-10-CM | POA: Diagnosis not present

## 2023-01-11 DIAGNOSIS — F4310 Post-traumatic stress disorder, unspecified: Secondary | ICD-10-CM | POA: Diagnosis not present

## 2023-01-11 DIAGNOSIS — Z1389 Encounter for screening for other disorder: Secondary | ICD-10-CM | POA: Diagnosis not present

## 2023-01-11 DIAGNOSIS — G629 Polyneuropathy, unspecified: Secondary | ICD-10-CM | POA: Diagnosis not present

## 2023-01-11 DIAGNOSIS — M169 Osteoarthritis of hip, unspecified: Secondary | ICD-10-CM | POA: Diagnosis not present

## 2023-01-11 DIAGNOSIS — M1250 Traumatic arthropathy, unspecified site: Secondary | ICD-10-CM | POA: Diagnosis not present

## 2023-01-12 ENCOUNTER — Other Ambulatory Visit: Payer: Self-pay | Admitting: Physician Assistant

## 2023-01-13 ENCOUNTER — Other Ambulatory Visit: Payer: Self-pay | Admitting: Physician Assistant

## 2023-01-23 DIAGNOSIS — M1731 Unilateral post-traumatic osteoarthritis, right knee: Secondary | ICD-10-CM | POA: Diagnosis not present

## 2023-01-23 DIAGNOSIS — M1651 Unilateral post-traumatic osteoarthritis, right hip: Secondary | ICD-10-CM | POA: Diagnosis not present

## 2023-01-23 DIAGNOSIS — M25361 Other instability, right knee: Secondary | ICD-10-CM | POA: Diagnosis not present

## 2023-02-09 ENCOUNTER — Other Ambulatory Visit: Payer: Self-pay | Admitting: Physician Assistant

## 2023-02-09 MED ORDER — ROSUVASTATIN CALCIUM 10 MG PO TABS: 10.0000 mg | ORAL_TABLET | Freq: Every day | ORAL | 0 refills | Status: DC

## 2023-02-16 DIAGNOSIS — F4310 Post-traumatic stress disorder, unspecified: Secondary | ICD-10-CM | POA: Diagnosis not present

## 2023-02-16 DIAGNOSIS — G894 Chronic pain syndrome: Secondary | ICD-10-CM | POA: Diagnosis not present

## 2023-02-16 DIAGNOSIS — G629 Polyneuropathy, unspecified: Secondary | ICD-10-CM | POA: Diagnosis not present

## 2023-02-16 DIAGNOSIS — M1250 Traumatic arthropathy, unspecified site: Secondary | ICD-10-CM | POA: Diagnosis not present

## 2023-02-16 DIAGNOSIS — M169 Osteoarthritis of hip, unspecified: Secondary | ICD-10-CM | POA: Diagnosis not present

## 2023-02-16 DIAGNOSIS — Z79891 Long term (current) use of opiate analgesic: Secondary | ICD-10-CM | POA: Diagnosis not present

## 2023-02-16 DIAGNOSIS — Z1389 Encounter for screening for other disorder: Secondary | ICD-10-CM | POA: Diagnosis not present

## 2023-02-21 DIAGNOSIS — R2689 Other abnormalities of gait and mobility: Secondary | ICD-10-CM | POA: Diagnosis not present

## 2023-02-21 DIAGNOSIS — M25461 Effusion, right knee: Secondary | ICD-10-CM | POA: Diagnosis not present

## 2023-02-21 DIAGNOSIS — M25661 Stiffness of right knee, not elsewhere classified: Secondary | ICD-10-CM | POA: Diagnosis not present

## 2023-02-21 DIAGNOSIS — M25561 Pain in right knee: Secondary | ICD-10-CM | POA: Diagnosis not present

## 2023-03-07 DIAGNOSIS — M25661 Stiffness of right knee, not elsewhere classified: Secondary | ICD-10-CM | POA: Diagnosis not present

## 2023-03-07 DIAGNOSIS — M25461 Effusion, right knee: Secondary | ICD-10-CM | POA: Diagnosis not present

## 2023-03-07 DIAGNOSIS — M25561 Pain in right knee: Secondary | ICD-10-CM | POA: Diagnosis not present

## 2023-03-07 DIAGNOSIS — R2689 Other abnormalities of gait and mobility: Secondary | ICD-10-CM | POA: Diagnosis not present

## 2023-03-15 DIAGNOSIS — M169 Osteoarthritis of hip, unspecified: Secondary | ICD-10-CM | POA: Diagnosis not present

## 2023-03-15 DIAGNOSIS — M1250 Traumatic arthropathy, unspecified site: Secondary | ICD-10-CM | POA: Diagnosis not present

## 2023-03-15 DIAGNOSIS — Z1389 Encounter for screening for other disorder: Secondary | ICD-10-CM | POA: Diagnosis not present

## 2023-03-15 DIAGNOSIS — Z79891 Long term (current) use of opiate analgesic: Secondary | ICD-10-CM | POA: Diagnosis not present

## 2023-03-15 DIAGNOSIS — F4310 Post-traumatic stress disorder, unspecified: Secondary | ICD-10-CM | POA: Diagnosis not present

## 2023-03-15 DIAGNOSIS — G629 Polyneuropathy, unspecified: Secondary | ICD-10-CM | POA: Diagnosis not present

## 2023-03-30 ENCOUNTER — Other Ambulatory Visit: Payer: Self-pay | Admitting: Physician Assistant

## 2023-03-30 DIAGNOSIS — E119 Type 2 diabetes mellitus without complications: Secondary | ICD-10-CM

## 2023-04-04 DIAGNOSIS — M25461 Effusion, right knee: Secondary | ICD-10-CM | POA: Diagnosis not present

## 2023-04-04 DIAGNOSIS — R2689 Other abnormalities of gait and mobility: Secondary | ICD-10-CM | POA: Diagnosis not present

## 2023-04-04 DIAGNOSIS — M25661 Stiffness of right knee, not elsewhere classified: Secondary | ICD-10-CM | POA: Diagnosis not present

## 2023-04-04 DIAGNOSIS — M25561 Pain in right knee: Secondary | ICD-10-CM | POA: Diagnosis not present

## 2023-04-06 ENCOUNTER — Other Ambulatory Visit: Payer: Self-pay | Admitting: Physician Assistant

## 2023-04-13 ENCOUNTER — Other Ambulatory Visit: Payer: Self-pay | Admitting: Physician Assistant

## 2023-04-16 DIAGNOSIS — F4310 Post-traumatic stress disorder, unspecified: Secondary | ICD-10-CM | POA: Diagnosis not present

## 2023-04-16 DIAGNOSIS — M169 Osteoarthritis of hip, unspecified: Secondary | ICD-10-CM | POA: Diagnosis not present

## 2023-04-16 DIAGNOSIS — G629 Polyneuropathy, unspecified: Secondary | ICD-10-CM | POA: Diagnosis not present

## 2023-04-16 DIAGNOSIS — M1250 Traumatic arthropathy, unspecified site: Secondary | ICD-10-CM | POA: Diagnosis not present

## 2023-04-16 DIAGNOSIS — Z79891 Long term (current) use of opiate analgesic: Secondary | ICD-10-CM | POA: Diagnosis not present

## 2023-04-16 DIAGNOSIS — Z1389 Encounter for screening for other disorder: Secondary | ICD-10-CM | POA: Diagnosis not present

## 2023-04-16 DIAGNOSIS — G894 Chronic pain syndrome: Secondary | ICD-10-CM | POA: Diagnosis not present

## 2023-04-19 ENCOUNTER — Ambulatory Visit (INDEPENDENT_AMBULATORY_CARE_PROVIDER_SITE_OTHER): Payer: Medicare HMO | Admitting: Physician Assistant

## 2023-04-19 ENCOUNTER — Encounter: Payer: Self-pay | Admitting: Physician Assistant

## 2023-04-19 VITALS — BP 100/70 | HR 64 | Temp 97.0°F | Ht 74.0 in | Wt 277.6 lb

## 2023-04-19 DIAGNOSIS — G8921 Chronic pain due to trauma: Secondary | ICD-10-CM | POA: Diagnosis not present

## 2023-04-19 DIAGNOSIS — F4310 Post-traumatic stress disorder, unspecified: Secondary | ICD-10-CM

## 2023-04-19 DIAGNOSIS — M545 Low back pain, unspecified: Secondary | ICD-10-CM

## 2023-04-19 DIAGNOSIS — E782 Mixed hyperlipidemia: Secondary | ICD-10-CM | POA: Diagnosis not present

## 2023-04-19 DIAGNOSIS — E1159 Type 2 diabetes mellitus with other circulatory complications: Secondary | ICD-10-CM

## 2023-04-19 DIAGNOSIS — E119 Type 2 diabetes mellitus without complications: Secondary | ICD-10-CM | POA: Diagnosis not present

## 2023-04-19 DIAGNOSIS — I152 Hypertension secondary to endocrine disorders: Secondary | ICD-10-CM | POA: Diagnosis not present

## 2023-04-19 DIAGNOSIS — Z125 Encounter for screening for malignant neoplasm of prostate: Secondary | ICD-10-CM | POA: Insufficient documentation

## 2023-04-19 DIAGNOSIS — G8929 Other chronic pain: Secondary | ICD-10-CM

## 2023-04-19 DIAGNOSIS — E785 Hyperlipidemia, unspecified: Secondary | ICD-10-CM

## 2023-04-19 DIAGNOSIS — I10 Essential (primary) hypertension: Secondary | ICD-10-CM | POA: Diagnosis not present

## 2023-04-19 DIAGNOSIS — E1169 Type 2 diabetes mellitus with other specified complication: Secondary | ICD-10-CM

## 2023-04-19 MED ORDER — CYCLOBENZAPRINE HCL 10 MG PO TABS
ORAL_TABLET | ORAL | 2 refills | Status: DC
Start: 2023-04-19 — End: 2023-08-13

## 2023-04-19 MED ORDER — PREDNISONE 20 MG PO TABS: ORAL_TABLET | ORAL | 0 refills | Status: DC
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
## 2023-04-20 LAB — CBC WITH DIFFERENTIAL/PLATELET
Basophils Absolute: 0.1 10*3/uL (ref 0.0–0.2)
Basos: 1 %
EOS (ABSOLUTE): 0.2 10*3/uL (ref 0.0–0.4)
Eos: 3 %
Hematocrit: 44.9 % (ref 37.5–51.0)
Hemoglobin: 15.1 g/dL (ref 13.0–17.7)
Immature Grans (Abs): 0 10*3/uL (ref 0.0–0.1)
Immature Granulocytes: 0 %
Lymphocytes Absolute: 1.7 10*3/uL (ref 0.7–3.1)
Lymphs: 23 %
MCH: 30.8 pg (ref 26.6–33.0)
MCHC: 33.6 g/dL (ref 31.5–35.7)
MCV: 92 fL (ref 79–97)
Monocytes Absolute: 0.6 10*3/uL (ref 0.1–0.9)
Monocytes: 8 %
Neutrophils Absolute: 4.9 10*3/uL (ref 1.4–7.0)
Neutrophils: 65 %
Platelets: 258 10*3/uL (ref 150–450)
RBC: 4.9 x10E6/uL (ref 4.14–5.80)
RDW: 12.7 % (ref 11.6–15.4)
WBC: 7.5 10*3/uL (ref 3.4–10.8)

## 2023-04-20 LAB — COMPREHENSIVE METABOLIC PANEL
ALT: 35 [IU]/L (ref 0–44)
AST: 35 [IU]/L (ref 0–40)
Albumin: 4.5 g/dL (ref 3.8–4.9)
Alkaline Phosphatase: 72 [IU]/L (ref 44–121)
BUN/Creatinine Ratio: 15 (ref 9–20)
BUN: 15 mg/dL (ref 6–24)
Bilirubin Total: 0.4 mg/dL (ref 0.0–1.2)
CO2: 22 mmol/L (ref 20–29)
Calcium: 9.9 mg/dL (ref 8.7–10.2)
Chloride: 101 mmol/L (ref 96–106)
Creatinine, Ser: 1.02 mg/dL (ref 0.76–1.27)
Globulin, Total: 3.1 g/dL (ref 1.5–4.5)
Glucose: 105 mg/dL — ABNORMAL HIGH (ref 70–99)
Potassium: 4.6 mmol/L (ref 3.5–5.2)
Sodium: 139 mmol/L (ref 134–144)
Total Protein: 7.6 g/dL (ref 6.0–8.5)
eGFR: 88 mL/min/{1.73_m2} (ref >59)

## 2023-04-20 LAB — LIPID PANEL
Chol/HDL Ratio: 3.6 {ratio} (ref 0.0–5.0)
Cholesterol, Total: 153 mg/dL (ref 100–199)
HDL: 43 mg/dL (ref >39)
LDL Chol Calc (NIH): 88 mg/dL (ref 0–99)
Triglycerides: 123 mg/dL (ref 0–149)
VLDL Cholesterol Cal: 22 mg/dL (ref 5–40)

## 2023-04-20 LAB — TSH: TSH: 1.64 u[IU]/mL (ref 0.450–4.500)

## 2023-04-20 LAB — PSA: Prostate Specific Ag, Serum: 0.5 ng/mL (ref 0.0–4.0)

## 2023-04-20 LAB — HEMOGLOBIN A1C
Est. average glucose Bld gHb Est-mCnc: 131 mg/dL
Hgb A1c MFr Bld: 6.2 % — ABNORMAL HIGH (ref 4.8–5.6)

## 2023-05-04 ENCOUNTER — Other Ambulatory Visit: Payer: Self-pay | Admitting: Physician Assistant

## 2023-05-06 ENCOUNTER — Other Ambulatory Visit: Payer: Self-pay | Admitting: Physician Assistant

## 2023-05-10 ENCOUNTER — Encounter: Payer: Self-pay | Admitting: Physician Assistant

## 2023-05-14 DIAGNOSIS — M25561 Pain in right knee: Secondary | ICD-10-CM | POA: Diagnosis not present

## 2023-05-14 DIAGNOSIS — R2689 Other abnormalities of gait and mobility: Secondary | ICD-10-CM | POA: Diagnosis not present

## 2023-05-14 DIAGNOSIS — M25661 Stiffness of right knee, not elsewhere classified: Secondary | ICD-10-CM | POA: Diagnosis not present

## 2023-05-14 DIAGNOSIS — M25461 Effusion, right knee: Secondary | ICD-10-CM | POA: Diagnosis not present

## 2023-05-17 ENCOUNTER — Other Ambulatory Visit: Payer: Self-pay | Admitting: Physician Assistant

## 2023-05-17 DIAGNOSIS — G8929 Other chronic pain: Secondary | ICD-10-CM

## 2023-05-17 DIAGNOSIS — G629 Polyneuropathy, unspecified: Secondary | ICD-10-CM | POA: Diagnosis not present

## 2023-05-17 DIAGNOSIS — M169 Osteoarthritis of hip, unspecified: Secondary | ICD-10-CM | POA: Diagnosis not present

## 2023-05-17 DIAGNOSIS — M1250 Traumatic arthropathy, unspecified site: Secondary | ICD-10-CM | POA: Diagnosis not present

## 2023-05-17 DIAGNOSIS — Z1389 Encounter for screening for other disorder: Secondary | ICD-10-CM | POA: Diagnosis not present

## 2023-05-17 DIAGNOSIS — F4310 Post-traumatic stress disorder, unspecified: Secondary | ICD-10-CM | POA: Diagnosis not present

## 2023-05-17 DIAGNOSIS — Z79891 Long term (current) use of opiate analgesic: Secondary | ICD-10-CM | POA: Diagnosis not present

## 2023-05-17 MED ORDER — PREDNISONE 20 MG PO TABS: ORAL_TABLET | ORAL | 0 refills | Status: DC

## 2023-06-04 DIAGNOSIS — M25661 Stiffness of right knee, not elsewhere classified: Secondary | ICD-10-CM | POA: Diagnosis not present

## 2023-06-04 DIAGNOSIS — M25461 Effusion, right knee: Secondary | ICD-10-CM | POA: Diagnosis not present

## 2023-06-04 DIAGNOSIS — M25561 Pain in right knee: Secondary | ICD-10-CM | POA: Diagnosis not present

## 2023-06-04 DIAGNOSIS — R2689 Other abnormalities of gait and mobility: Secondary | ICD-10-CM | POA: Diagnosis not present

## 2023-06-19 ENCOUNTER — Other Ambulatory Visit: Payer: Self-pay | Admitting: Medical Genetics

## 2023-06-26 DIAGNOSIS — F4310 Post-traumatic stress disorder, unspecified: Secondary | ICD-10-CM | POA: Diagnosis not present

## 2023-06-26 DIAGNOSIS — G629 Polyneuropathy, unspecified: Secondary | ICD-10-CM | POA: Diagnosis not present

## 2023-06-26 DIAGNOSIS — M1250 Traumatic arthropathy, unspecified site: Secondary | ICD-10-CM | POA: Diagnosis not present

## 2023-06-26 DIAGNOSIS — Z79891 Long term (current) use of opiate analgesic: Secondary | ICD-10-CM | POA: Diagnosis not present

## 2023-06-26 DIAGNOSIS — Z1389 Encounter for screening for other disorder: Secondary | ICD-10-CM | POA: Diagnosis not present

## 2023-06-26 DIAGNOSIS — M169 Osteoarthritis of hip, unspecified: Secondary | ICD-10-CM | POA: Diagnosis not present

## 2023-06-28 ENCOUNTER — Encounter: Payer: Self-pay | Admitting: Physician Assistant

## 2023-06-28 NOTE — Telephone Encounter (Signed)
 Care team updated and letter sent for eye exam notes.

## 2023-06-29 ENCOUNTER — Other Ambulatory Visit: Payer: Self-pay | Admitting: Physician Assistant

## 2023-06-29 DIAGNOSIS — E119 Type 2 diabetes mellitus without complications: Secondary | ICD-10-CM

## 2023-07-06 ENCOUNTER — Other Ambulatory Visit: Payer: Self-pay | Admitting: Physician Assistant

## 2023-07-12 ENCOUNTER — Other Ambulatory Visit: Payer: Self-pay | Admitting: Physician Assistant

## 2023-07-16 ENCOUNTER — Other Ambulatory Visit (HOSPITAL_COMMUNITY): Payer: Medicare HMO

## 2023-07-23 DIAGNOSIS — M25561 Pain in right knee: Secondary | ICD-10-CM | POA: Diagnosis not present

## 2023-07-23 DIAGNOSIS — M25661 Stiffness of right knee, not elsewhere classified: Secondary | ICD-10-CM | POA: Diagnosis not present

## 2023-07-23 DIAGNOSIS — M25461 Effusion, right knee: Secondary | ICD-10-CM | POA: Diagnosis not present

## 2023-07-23 DIAGNOSIS — R2689 Other abnormalities of gait and mobility: Secondary | ICD-10-CM | POA: Diagnosis not present

## 2023-07-31 DIAGNOSIS — M25661 Stiffness of right knee, not elsewhere classified: Secondary | ICD-10-CM | POA: Diagnosis not present

## 2023-07-31 DIAGNOSIS — R2689 Other abnormalities of gait and mobility: Secondary | ICD-10-CM | POA: Diagnosis not present

## 2023-07-31 DIAGNOSIS — M25561 Pain in right knee: Secondary | ICD-10-CM | POA: Diagnosis not present

## 2023-07-31 DIAGNOSIS — M25461 Effusion, right knee: Secondary | ICD-10-CM | POA: Diagnosis not present

## 2023-08-02 ENCOUNTER — Other Ambulatory Visit: Payer: Self-pay | Admitting: Physician Assistant

## 2023-08-06 ENCOUNTER — Other Ambulatory Visit (HOSPITAL_COMMUNITY)
Admission: RE | Admit: 2023-08-06 | Discharge: 2023-08-06 | Disposition: A | Discharge status: Home / Self Care | Payer: Self-pay | Source: Ambulatory Visit | Attending: Oncology | Admitting: Oncology

## 2023-08-10 DIAGNOSIS — M25661 Stiffness of right knee, not elsewhere classified: Secondary | ICD-10-CM | POA: Diagnosis not present

## 2023-08-10 DIAGNOSIS — M25461 Effusion, right knee: Secondary | ICD-10-CM | POA: Diagnosis not present

## 2023-08-10 DIAGNOSIS — M25561 Pain in right knee: Secondary | ICD-10-CM | POA: Diagnosis not present

## 2023-08-10 DIAGNOSIS — R2689 Other abnormalities of gait and mobility: Secondary | ICD-10-CM | POA: Diagnosis not present

## 2023-08-12 ENCOUNTER — Other Ambulatory Visit: Payer: Self-pay | Admitting: Physician Assistant

## 2023-08-12 DIAGNOSIS — G8929 Other chronic pain: Secondary | ICD-10-CM

## 2023-08-16 LAB — GENECONNECT MOLECULAR SCREEN: Genetic Analysis Overall Interpretation: NEGATIVE

## 2023-08-30 DIAGNOSIS — M169 Osteoarthritis of hip, unspecified: Secondary | ICD-10-CM | POA: Diagnosis not present

## 2023-08-30 DIAGNOSIS — Z79891 Long term (current) use of opiate analgesic: Secondary | ICD-10-CM | POA: Diagnosis not present

## 2023-08-30 DIAGNOSIS — G894 Chronic pain syndrome: Secondary | ICD-10-CM | POA: Diagnosis not present

## 2023-08-30 DIAGNOSIS — F4310 Post-traumatic stress disorder, unspecified: Secondary | ICD-10-CM | POA: Diagnosis not present

## 2023-08-30 DIAGNOSIS — Z1389 Encounter for screening for other disorder: Secondary | ICD-10-CM | POA: Diagnosis not present

## 2023-08-30 DIAGNOSIS — G629 Polyneuropathy, unspecified: Secondary | ICD-10-CM | POA: Diagnosis not present

## 2023-08-30 DIAGNOSIS — M1250 Traumatic arthropathy, unspecified site: Secondary | ICD-10-CM | POA: Diagnosis not present

## 2023-09-10 DIAGNOSIS — M25661 Stiffness of right knee, not elsewhere classified: Secondary | ICD-10-CM | POA: Diagnosis not present

## 2023-09-10 DIAGNOSIS — R2689 Other abnormalities of gait and mobility: Secondary | ICD-10-CM | POA: Diagnosis not present

## 2023-09-10 DIAGNOSIS — M25561 Pain in right knee: Secondary | ICD-10-CM | POA: Diagnosis not present

## 2023-09-10 DIAGNOSIS — M25461 Effusion, right knee: Secondary | ICD-10-CM | POA: Diagnosis not present

## 2023-09-14 ENCOUNTER — Ambulatory Visit: Payer: Medicare HMO | Admitting: Physician Assistant

## 2023-09-14 ENCOUNTER — Encounter: Payer: Self-pay | Admitting: Physician Assistant

## 2023-09-14 VITALS — BP 132/80 | HR 95 | Temp 97.5°F | Resp 18 | Ht 74.0 in | Wt 285.2 lb

## 2023-09-14 DIAGNOSIS — J060 Acute laryngopharyngitis: Secondary | ICD-10-CM | POA: Diagnosis not present

## 2023-09-14 DIAGNOSIS — U071 COVID-19: Secondary | ICD-10-CM

## 2023-09-14 LAB — POC COVID19 BINAXNOW: SARS Coronavirus 2 Ag: POSITIVE — AB

## 2023-09-14 LAB — POCT INFLUENZA A/B
Influenza A, POC: NEGATIVE
Influenza B, POC: NEGATIVE

## 2023-09-14 MED ORDER — PROMETHAZINE-DM 6.25-15 MG/5ML PO SYRP: 5.0000 mL | ORAL_SOLUTION | Freq: Four times a day (QID) | ORAL | 0 refills | Status: DC | PRN

## 2023-09-14 MED ORDER — AMOXICILLIN-POT CLAVULANATE 875-125 MG PO TABS: 1.0000 | ORAL_TABLET | Freq: Two times a day (BID) | ORAL | 0 refills | Status: DC

## 2023-09-14 NOTE — Progress Notes (Signed)
 Acute Office Visit  Subjective:    Patient ID: Aaron Holland, male    DOB: January 29, 1971, 53 y.o.   MRN: [id number]  Chief Complaint  Patient presents with   Nasal Congestion   Facial Pain   Sinusitis    HPI: Patient is in today for complaints of malaise, headache, productive cough and sinus pain and pressure.  Symptoms started on Monday -  Denies fever.  Does have scratchy throat    Current Outpatient Medications:    amLODipine (NORVASC) 10 MG tablet, TAKE 1 TABLET BY MOUTH EVERY DAY, Disp: 90 tablet, Rfl: 0   amoxicillin-clavulanate (AUGMENTIN) 875-125 MG tablet, Take 1 tablet by mouth 2 (two) times daily., Disp: 20 tablet, Rfl: 0   B Complex-C (B-COMPLEX WITH VITAMIN C) tablet, Take 1 tablet by mouth daily., Disp: , Rfl:    cyclobenzaprine (FLEXERIL) 10 MG tablet, TAKE 1 TABLET BY MOUTH TWICE A DAY AS NEEDED, Disp: 60 tablet, Rfl: 2   Docusate Sodium (DSS) 100 MG CAPS, Take by mouth., Disp: , Rfl:    fexofenadine (ALLEGRA) 180 MG tablet, Take 180 mg by mouth daily., Disp: , Rfl:    gabapentin (NEURONTIN) 300 MG capsule, Take 300 mg by mouth 3 (three) times daily., Disp: , Rfl:    Ginger, Zingiber officinalis, (GINGER ROOT) 550 MG CAPS, Take 1 capsule by mouth daily., Disp: , Rfl:    HYDROcodone-acetaminophen (NORCO) 10-325 MG tablet, Take 1 tablet by mouth every 6 (six) hours as needed., Disp: , Rfl:    hydrOXYzine (ATARAX) 25 MG tablet, Take 25 mg by mouth in the morning and at bedtime., Disp: , Rfl:    lidocaine (LIDODERM) 5 %, 2 patches daily., Disp: , Rfl:    losartan-hydrochlorothiazide (HYZAAR) 100-25 MG tablet, TAKE 1 TABLET BY MOUTH EVERY DAY, Disp: 90 tablet, Rfl: 0   meloxicam (MOBIC) 15 MG tablet, TAKE 1 TABLET (15 MG TOTAL) BY MOUTH DAILY., Disp: 90 tablet, Rfl: 0   metFORMIN (GLUCOPHAGE) 500 MG tablet, TAKE 1 TABLET BY MOUTH EVERY DAY WITH BREAKFAST, Disp: 90 tablet, Rfl: 0   naloxone (NARCAN) nasal spray 4 mg/0.1 mL, SMARTSIG:1 Spray(s) Both Nares PRN, Disp: ,  Rfl:    Omega-3 1000 MG CAPS, Take 900 mg by mouth daily., Disp: , Rfl:    Potassium Gluconate 595 MG TBCR, Take 595 each by mouth., Disp: , Rfl:    promethazine-dextromethorphan (PROMETHAZINE-DM) 6.25-15 MG/5ML syrup, Take 5 mLs by mouth 4 (four) times daily as needed., Disp: 118 mL, Rfl: 0   rosuvastatin (CRESTOR) 10 MG tablet, TAKE 1 TABLET BY MOUTH EVERY DAY, Disp: 90 tablet, Rfl: 1  No Known Allergies  ROS CONSTITUTIONAL: see HPI E/N/T:see HPI CARDIOVASCULAR: Negative for chest pain, dizziness, palpitations and pedal edema.  RESPIRATORY: see HPI GASTROINTESTINAL: Negative for abdominal pain, acid reflux symptoms, constipation, diarrhea, nausea and vomiting.       Objective:    PHYSICAL EXAM:   BP 132/80 (BP Location: Right Arm, Patient Position: Sitting, Cuff Size: Large)   Pulse 95   Temp (!) 97.5 F (36.4 C) (Temporal)   Resp 18   Ht 6\' 2"  (1.88 m)   Wt 285 lb 3.2 oz (129.4 kg)   SpO2 96%   BMI 36.62 kg/m    GEN: Well nourished, well developed, in no acute distress  HEENT: normal external ears and nose - normal external auditory canals and TMS - m - Lips, Teeth and Gums - normal  Oropharynx - erythema/pnd  Cardiac: RRR; no murmurs,  Respiratory:  scattered rhonchi  Skin: warm and dry, no rash   Office Visit on 09/14/2023  Component Date Value Ref Range Status   SARS Coronavirus 2 Ag 09/14/2023 Positive (A)  Negative Final   Influenza A, POC 09/14/2023 Negative  Negative Final   Influenza B, POC 09/14/2023 Negative  Negative Final        Assessment & Plan:    Acute laryngopharyngitis -     POC COVID-19 BinaxNow -     POCT Influenza A/B -     Amoxicillin-Pot Clavulanate; Take 1 tablet by mouth 2 (two) times daily.  Dispense: 20 tablet; Refill: 0 -     Promethazine-DM; Take 5 mLs by mouth 4 (four) times daily as needed.  Dispense: 118 mL; Refill: 0  COVID-19 -     Promethazine-DM; Take 5 mLs by mouth 4 (four) times daily as needed.  Dispense: 118 mL;  Refill: 0 Recommend rest, fluids    Follow-up: Return if symptoms worsen or fail to improve.  An After Visit Summary was printed and given to the patient.  Jettie Pagan [facility] [phone]

## 2023-09-27 DIAGNOSIS — F4310 Post-traumatic stress disorder, unspecified: Secondary | ICD-10-CM | POA: Diagnosis not present

## 2023-09-27 DIAGNOSIS — M169 Osteoarthritis of hip, unspecified: Secondary | ICD-10-CM | POA: Diagnosis not present

## 2023-09-27 DIAGNOSIS — Z1389 Encounter for screening for other disorder: Secondary | ICD-10-CM | POA: Diagnosis not present

## 2023-09-27 DIAGNOSIS — G629 Polyneuropathy, unspecified: Secondary | ICD-10-CM | POA: Diagnosis not present

## 2023-09-27 DIAGNOSIS — Z79891 Long term (current) use of opiate analgesic: Secondary | ICD-10-CM | POA: Diagnosis not present

## 2023-09-27 DIAGNOSIS — M1250 Traumatic arthropathy, unspecified site: Secondary | ICD-10-CM | POA: Diagnosis not present

## 2023-09-28 ENCOUNTER — Other Ambulatory Visit: Payer: Self-pay | Admitting: Physician Assistant

## 2023-09-28 DIAGNOSIS — E119 Type 2 diabetes mellitus without complications: Secondary | ICD-10-CM

## 2023-10-01 DIAGNOSIS — M25561 Pain in right knee: Secondary | ICD-10-CM | POA: Diagnosis not present

## 2023-10-01 DIAGNOSIS — R2689 Other abnormalities of gait and mobility: Secondary | ICD-10-CM | POA: Diagnosis not present

## 2023-10-01 DIAGNOSIS — M25661 Stiffness of right knee, not elsewhere classified: Secondary | ICD-10-CM | POA: Diagnosis not present

## 2023-10-01 DIAGNOSIS — M25461 Effusion, right knee: Secondary | ICD-10-CM | POA: Diagnosis not present

## 2023-10-03 ENCOUNTER — Other Ambulatory Visit: Payer: Self-pay | Admitting: Physician Assistant

## 2023-10-06 ENCOUNTER — Telehealth: Payer: Self-pay

## 2023-10-06 NOTE — Telephone Encounter (Signed)
 Copied from CRM [id number]. Topic: Appointments - Scheduling Inquiry for Clinic >> Oct 05, 2023  4:25 PM Franchot Heidelberg wrote: Reason for CRM: [place] with [facility] called to report that the patient wants to have a kidney evaluation screening added to his upcoming appt.   This involves the EGFR Blood Test and the UACR Urine Test  Best contact: [phone]  Fax number provided

## 2023-10-08 ENCOUNTER — Other Ambulatory Visit: Payer: Self-pay | Admitting: Family Medicine

## 2023-10-22 ENCOUNTER — Ambulatory Visit (INDEPENDENT_AMBULATORY_CARE_PROVIDER_SITE_OTHER): Payer: Medicare HMO | Admitting: Physician Assistant

## 2023-10-22 ENCOUNTER — Encounter: Payer: Self-pay | Admitting: Physician Assistant

## 2023-10-22 VITALS — BP 118/74 | HR 91 | Temp 98.3°F | Resp 18 | Ht 74.0 in | Wt 286.6 lb

## 2023-10-22 DIAGNOSIS — G8921 Chronic pain due to trauma: Secondary | ICD-10-CM

## 2023-10-22 DIAGNOSIS — E1159 Type 2 diabetes mellitus with other circulatory complications: Secondary | ICD-10-CM

## 2023-10-22 DIAGNOSIS — I152 Hypertension secondary to endocrine disorders: Secondary | ICD-10-CM | POA: Diagnosis not present

## 2023-10-22 DIAGNOSIS — E119 Type 2 diabetes mellitus without complications: Secondary | ICD-10-CM

## 2023-10-22 DIAGNOSIS — E785 Hyperlipidemia, unspecified: Secondary | ICD-10-CM

## 2023-10-22 DIAGNOSIS — F4310 Post-traumatic stress disorder, unspecified: Secondary | ICD-10-CM | POA: Diagnosis not present

## 2023-10-22 DIAGNOSIS — E1169 Type 2 diabetes mellitus with other specified complication: Secondary | ICD-10-CM | POA: Diagnosis not present
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
## 2023-10-24 ENCOUNTER — Encounter: Payer: Self-pay | Admitting: Family Medicine

## 2023-10-24 DIAGNOSIS — Z79891 Long term (current) use of opiate analgesic: Secondary | ICD-10-CM | POA: Diagnosis not present

## 2023-10-24 DIAGNOSIS — M1250 Traumatic arthropathy, unspecified site: Secondary | ICD-10-CM | POA: Diagnosis not present

## 2023-10-24 DIAGNOSIS — Z1389 Encounter for screening for other disorder: Secondary | ICD-10-CM | POA: Diagnosis not present

## 2023-10-24 DIAGNOSIS — G629 Polyneuropathy, unspecified: Secondary | ICD-10-CM | POA: Diagnosis not present

## 2023-10-24 DIAGNOSIS — M169 Osteoarthritis of hip, unspecified: Secondary | ICD-10-CM | POA: Diagnosis not present

## 2023-10-24 DIAGNOSIS — F4310 Post-traumatic stress disorder, unspecified: Secondary | ICD-10-CM | POA: Diagnosis not present

## 2023-10-24 LAB — COMPREHENSIVE METABOLIC PANEL WITH GFR
ALT: 25 IU/L (ref 0–44)
AST: 23 IU/L (ref 0–40)
Albumin: 4.2 g/dL (ref 3.8–4.9)
Alkaline Phosphatase: 65 IU/L (ref 44–121)
BUN/Creatinine Ratio: 15 (ref 9–20)
BUN: 16 mg/dL (ref 6–24)
Bilirubin Total: 0.3 mg/dL (ref 0.0–1.2)
CO2: 23 mmol/L (ref 20–29)
Calcium: 9.5 mg/dL (ref 8.7–10.2)
Chloride: 102 mmol/L (ref 96–106)
Creatinine, Ser: 1.07 mg/dL (ref 0.76–1.27)
Globulin, Total: 2.7 g/dL (ref 1.5–4.5)
Glucose: 117 mg/dL — ABNORMAL HIGH (ref 70–99)
Potassium: 4.2 mmol/L (ref 3.5–5.2)
Sodium: 138 mmol/L (ref 134–144)
Total Protein: 6.9 g/dL (ref 6.0–8.5)
eGFR: 83 mL/min/{1.73_m2} (ref >59)

## 2023-10-24 LAB — CBC WITH DIFFERENTIAL/PLATELET
Basophils Absolute: 0.1 10*3/uL (ref 0.0–0.2)
Basos: 1 %
EOS (ABSOLUTE): 0.1 10*3/uL (ref 0.0–0.4)
Eos: 2 %
Hematocrit: 41.8 % (ref 37.5–51.0)
Hemoglobin: 14.3 g/dL (ref 13.0–17.7)
Immature Grans (Abs): 0 10*3/uL (ref 0.0–0.1)
Immature Granulocytes: 0 %
Lymphocytes Absolute: 2 10*3/uL (ref 0.7–3.1)
Lymphs: 29 %
MCH: 30.8 pg (ref 26.6–33.0)
MCHC: 34.2 g/dL (ref 31.5–35.7)
MCV: 90 fL (ref 79–97)
Monocytes Absolute: 0.7 10*3/uL (ref 0.1–0.9)
Monocytes: 9 %
Neutrophils Absolute: 4.2 10*3/uL (ref 1.4–7.0)
Neutrophils: 59 %
Platelets: 263 10*3/uL (ref 150–450)
RBC: 4.65 x10E6/uL (ref 4.14–5.80)
RDW: 12.1 % (ref 11.6–15.4)
WBC: 7.2 10*3/uL (ref 3.4–10.8)

## 2023-10-24 LAB — HEMOGLOBIN A1C
Est. average glucose Bld gHb Est-mCnc: 123 mg/dL
Hgb A1c MFr Bld: 5.9 % — ABNORMAL HIGH (ref 4.8–5.6)

## 2023-10-24 LAB — MICROALBUMIN / CREATININE URINE RATIO
Creatinine, Urine: 174.5 mg/dL
Microalb/Creat Ratio: 3 mg/g{creat} (ref 0–29)
Microalbumin, Urine: 6 ug/mL

## 2023-10-24 LAB — LIPID PANEL
Chol/HDL Ratio: 3.1 ratio (ref 0.0–5.0)
Cholesterol, Total: 137 mg/dL (ref 100–199)
HDL: 44 mg/dL (ref >39)
LDL Chol Calc (NIH): 78 mg/dL (ref 0–99)
Triglycerides: 78 mg/dL (ref 0–149)
VLDL Cholesterol Cal: 15 mg/dL (ref 5–40)

## 2023-10-24 LAB — TSH: TSH: 1.28 u[IU]/mL (ref 0.450–4.500)

## 2023-10-27 ENCOUNTER — Other Ambulatory Visit: Payer: Self-pay | Admitting: Physician Assistant

## 2023-10-27 ENCOUNTER — Other Ambulatory Visit: Payer: Self-pay | Admitting: Family Medicine

## 2023-11-03 ENCOUNTER — Other Ambulatory Visit: Payer: Self-pay | Admitting: Physician Assistant

## 2023-11-03 DIAGNOSIS — G8929 Other chronic pain: Secondary | ICD-10-CM

## 2023-11-06 DIAGNOSIS — M25461 Effusion, right knee: Secondary | ICD-10-CM | POA: Diagnosis not present

## 2023-11-06 DIAGNOSIS — R2689 Other abnormalities of gait and mobility: Secondary | ICD-10-CM | POA: Diagnosis not present

## 2023-11-06 DIAGNOSIS — M25661 Stiffness of right knee, not elsewhere classified: Secondary | ICD-10-CM | POA: Diagnosis not present

## 2023-11-06 DIAGNOSIS — M25561 Pain in right knee: Secondary | ICD-10-CM | POA: Diagnosis not present

## 2023-12-04 DIAGNOSIS — G894 Chronic pain syndrome: Secondary | ICD-10-CM | POA: Diagnosis not present

## 2023-12-05 DIAGNOSIS — M25461 Effusion, right knee: Secondary | ICD-10-CM | POA: Diagnosis not present

## 2023-12-05 DIAGNOSIS — R2689 Other abnormalities of gait and mobility: Secondary | ICD-10-CM | POA: Diagnosis not present

## 2023-12-05 DIAGNOSIS — M25561 Pain in right knee: Secondary | ICD-10-CM | POA: Diagnosis not present

## 2023-12-05 DIAGNOSIS — M25661 Stiffness of right knee, not elsewhere classified: Secondary | ICD-10-CM | POA: Diagnosis not present

## 2023-12-16 ENCOUNTER — Encounter: Payer: Self-pay | Admitting: Physician Assistant

## 2023-12-25 ENCOUNTER — Other Ambulatory Visit: Payer: Self-pay | Admitting: Physician Assistant

## 2023-12-25 DIAGNOSIS — E119 Type 2 diabetes mellitus without complications: Secondary | ICD-10-CM

## 2023-12-27 ENCOUNTER — Ambulatory Visit

## 2023-12-27 VITALS — Ht 74.0 in | Wt 286.0 lb

## 2023-12-27 DIAGNOSIS — Z0000 Encounter for general adult medical examination without abnormal findings: Secondary | ICD-10-CM

## 2023-12-27 NOTE — Patient Instructions (Signed)
 Aaron Holland , Thank you for taking time out of your busy schedule to complete your Annual Wellness Visit with me. I enjoyed our conversation and look forward to speaking with you again next year. I, as well as your care team,  appreciate your ongoing commitment to your health goals. Please review the following plan we discussed and let me know if I can assist you in the future. Your Game plan/ To Do List    Follow up Visits: Next Medicare AWV with our clinical staff: In 1 year    Have you seen your provider in the last 6 months (3 months if uncontrolled diabetes)? Yes Next Office Visit with your provider: 04/23/24 @ 9:00  Clinician Recommendations:  Aim for 30 minutes of exercise or brisk walking, 6-8 glasses of water, and 5 servings of fruits and vegetables each day.       This is a list of the screening recommended for you and due dates:  Health Maintenance  Topic Date Due   Eye exam for diabetics  12/03/2022   Flu Shot  02/15/2024   Hemoglobin A1C  04/22/2024   Yearly kidney function blood test for diabetes  10/21/2024   Yearly kidney health urinalysis for diabetes  10/21/2024   Complete foot exam   10/21/2024   Medicare Annual Wellness Visit  12/26/2024   Cologuard (Stool DNA test)  07/18/2025   HPV Vaccine  Aged Out   Meningitis B Vaccine  Aged Out   DTaP/Tdap/Td vaccine  Discontinued   Pneumococcal Vaccination  Discontinued   COVID-19 Vaccine  Discontinued   Hepatitis C Screening  Discontinued   HIV Screening  Discontinued   Zoster (Shingles) Vaccine  Discontinued    Advanced directives: (ACP Link)Information on Advanced Care Planning can be found at [place]  Secretary of [facility] Advance Health Care Directives Advance Health Care Directives. [email or web address]  Advance Care Planning is important because it:  [x]  Makes sure you receive the medical care that is consistent with your values, goals, and preferences  [x]  It provides guidance to your family and loved ones and reduces  their decisional burden about whether or not they are making the right decisions based on your wishes.  Follow the link provided in your after visit summary or read over the paperwork we have mailed to you to help you started getting your Advance Directives in place. If you need assistance in completing these, please reach out to us  so that we can help you!  See attachments for Preventive Care and Fall Prevention Tips.
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
## 2023-12-29 ENCOUNTER — Other Ambulatory Visit: Payer: Self-pay | Admitting: Physician Assistant

## 2024-01-02 DIAGNOSIS — R2689 Other abnormalities of gait and mobility: Secondary | ICD-10-CM | POA: Diagnosis not present

## 2024-01-02 DIAGNOSIS — M25561 Pain in right knee: Secondary | ICD-10-CM | POA: Diagnosis not present

## 2024-01-02 DIAGNOSIS — M25461 Effusion, right knee: Secondary | ICD-10-CM | POA: Diagnosis not present

## 2024-01-02 DIAGNOSIS — M25661 Stiffness of right knee, not elsewhere classified: Secondary | ICD-10-CM | POA: Diagnosis not present

## 2024-01-05 ENCOUNTER — Other Ambulatory Visit: Payer: Self-pay | Admitting: Physician Assistant

## 2024-01-24 ENCOUNTER — Other Ambulatory Visit: Payer: Self-pay | Admitting: Physician Assistant

## 2024-01-24 DIAGNOSIS — M545 Low back pain, unspecified: Secondary | ICD-10-CM

## 2024-01-30 DIAGNOSIS — Z9889 Other specified postprocedural states: Secondary | ICD-10-CM | POA: Diagnosis not present

## 2024-01-30 DIAGNOSIS — M1731 Unilateral post-traumatic osteoarthritis, right knee: Secondary | ICD-10-CM | POA: Diagnosis not present

## 2024-01-30 DIAGNOSIS — M25361 Other instability, right knee: Secondary | ICD-10-CM | POA: Diagnosis not present

## 2024-01-30 DIAGNOSIS — M1651 Unilateral post-traumatic osteoarthritis, right hip: Secondary | ICD-10-CM | POA: Diagnosis not present

## 2024-02-05 DIAGNOSIS — M25661 Stiffness of right knee, not elsewhere classified: Secondary | ICD-10-CM | POA: Diagnosis not present

## 2024-02-05 DIAGNOSIS — M25561 Pain in right knee: Secondary | ICD-10-CM | POA: Diagnosis not present

## 2024-02-05 DIAGNOSIS — M25461 Effusion, right knee: Secondary | ICD-10-CM | POA: Diagnosis not present

## 2024-02-05 DIAGNOSIS — R2689 Other abnormalities of gait and mobility: Secondary | ICD-10-CM | POA: Diagnosis not present

## 2024-02-06 DIAGNOSIS — M169 Osteoarthritis of hip, unspecified: Secondary | ICD-10-CM | POA: Diagnosis not present

## 2024-02-26 DIAGNOSIS — M25661 Stiffness of right knee, not elsewhere classified: Secondary | ICD-10-CM | POA: Diagnosis not present

## 2024-02-26 DIAGNOSIS — R2689 Other abnormalities of gait and mobility: Secondary | ICD-10-CM | POA: Diagnosis not present

## 2024-02-26 DIAGNOSIS — M25561 Pain in right knee: Secondary | ICD-10-CM | POA: Diagnosis not present

## 2024-02-26 DIAGNOSIS — M25461 Effusion, right knee: Secondary | ICD-10-CM | POA: Diagnosis not present

## 2024-03-13 DIAGNOSIS — M169 Osteoarthritis of hip, unspecified: Secondary | ICD-10-CM | POA: Diagnosis not present

## 2024-03-13 DIAGNOSIS — Z79891 Long term (current) use of opiate analgesic: Secondary | ICD-10-CM | POA: Diagnosis not present

## 2024-03-22 ENCOUNTER — Other Ambulatory Visit: Payer: Self-pay | Admitting: Physician Assistant

## 2024-03-22 DIAGNOSIS — E119 Type 2 diabetes mellitus without complications: Secondary | ICD-10-CM

## 2024-03-25 ENCOUNTER — Encounter: Payer: Self-pay | Admitting: Physician Assistant

## 2024-03-27 ENCOUNTER — Other Ambulatory Visit: Payer: Self-pay | Admitting: Physician Assistant

## 2024-04-15 ENCOUNTER — Other Ambulatory Visit: Payer: Self-pay | Admitting: Physician Assistant

## 2024-04-23 ENCOUNTER — Encounter: Payer: Self-pay | Admitting: Physician Assistant

## 2024-04-23 ENCOUNTER — Ambulatory Visit (INDEPENDENT_AMBULATORY_CARE_PROVIDER_SITE_OTHER): Admitting: Physician Assistant

## 2024-04-23 VITALS — BP 134/82 | HR 81 | Temp 98.0°F | Resp 18 | Ht 74.0 in | Wt 288.2 lb

## 2024-04-23 DIAGNOSIS — E785 Hyperlipidemia, unspecified: Secondary | ICD-10-CM | POA: Diagnosis not present

## 2024-04-23 DIAGNOSIS — I152 Hypertension secondary to endocrine disorders: Secondary | ICD-10-CM

## 2024-04-23 DIAGNOSIS — J060 Acute laryngopharyngitis: Secondary | ICD-10-CM

## 2024-04-23 DIAGNOSIS — G8921 Chronic pain due to trauma: Secondary | ICD-10-CM | POA: Diagnosis not present

## 2024-04-23 DIAGNOSIS — Z125 Encounter for screening for malignant neoplasm of prostate: Secondary | ICD-10-CM | POA: Diagnosis not present

## 2024-04-23 DIAGNOSIS — F4310 Post-traumatic stress disorder, unspecified: Secondary | ICD-10-CM | POA: Diagnosis not present

## 2024-04-23 DIAGNOSIS — E1159 Type 2 diabetes mellitus with other circulatory complications: Secondary | ICD-10-CM

## 2024-04-23 DIAGNOSIS — E119 Type 2 diabetes mellitus without complications: Secondary | ICD-10-CM

## 2024-04-23 DIAGNOSIS — E1169 Type 2 diabetes mellitus with other specified complication: Secondary | ICD-10-CM

## 2024-04-23 MED ORDER — AMOXICILLIN-POT CLAVULANATE 875-125 MG PO TABS: 1.0000 | ORAL_TABLET | Freq: Two times a day (BID) | ORAL | 0 refills | Status: AC
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
## 2024-04-24 ENCOUNTER — Ambulatory Visit: Payer: Self-pay | Admitting: Physician Assistant

## 2024-04-24 ENCOUNTER — Other Ambulatory Visit: Payer: Self-pay | Admitting: Physician Assistant

## 2024-04-24 DIAGNOSIS — M1711 Unilateral primary osteoarthritis, right knee: Secondary | ICD-10-CM | POA: Diagnosis not present

## 2024-04-24 DIAGNOSIS — M169 Osteoarthritis of hip, unspecified: Secondary | ICD-10-CM | POA: Diagnosis not present

## 2024-04-24 LAB — LIPID PANEL
Chol/HDL Ratio: 2.8 ratio (ref 0.0–5.0)
Cholesterol, Total: 145 mg/dL (ref 100–199)
HDL: 51 mg/dL (ref >39)
LDL Chol Calc (NIH): 75 mg/dL (ref 0–99)
Triglycerides: 106 mg/dL (ref 0–149)
VLDL Cholesterol Cal: 19 mg/dL (ref 5–40)

## 2024-04-24 LAB — CBC WITH DIFFERENTIAL/PLATELET
Basophils Absolute: 0.1 x10E3/uL (ref 0.0–0.2)
Basos: 1 %
EOS (ABSOLUTE): 0.2 x10E3/uL (ref 0.0–0.4)
Eos: 2 %
Hematocrit: 45.6 % (ref 37.5–51.0)
Hemoglobin: 14.6 g/dL (ref 13.0–17.7)
Immature Grans (Abs): 0 x10E3/uL (ref 0.0–0.1)
Immature Granulocytes: 0 %
Lymphocytes Absolute: 1.3 x10E3/uL (ref 0.7–3.1)
Lymphs: 16 %
MCH: 29.9 pg (ref 26.6–33.0)
MCHC: 32 g/dL (ref 31.5–35.7)
MCV: 93 fL (ref 79–97)
Monocytes Absolute: 0.8 x10E3/uL (ref 0.1–0.9)
Monocytes: 10 %
Neutrophils Absolute: 5.9 x10E3/uL (ref 1.4–7.0)
Neutrophils: 71 %
Platelets: 263 x10E3/uL (ref 150–450)
RBC: 4.88 x10E6/uL (ref 4.14–5.80)
RDW: 12.9 % (ref 11.6–15.4)
WBC: 8.3 x10E3/uL (ref 3.4–10.8)

## 2024-04-24 LAB — COMPREHENSIVE METABOLIC PANEL WITH GFR
ALT: 31 IU/L (ref 0–44)
AST: 28 IU/L (ref 0–40)
Albumin: 4.4 g/dL (ref 3.8–4.9)
Alkaline Phosphatase: 68 IU/L (ref 47–123)
BUN/Creatinine Ratio: 14 (ref 9–20)
BUN: 15 mg/dL (ref 6–24)
Bilirubin Total: 0.3 mg/dL (ref 0.0–1.2)
CO2: 23 mmol/L (ref 20–29)
Calcium: 9.7 mg/dL (ref 8.7–10.2)
Chloride: 101 mmol/L (ref 96–106)
Creatinine, Ser: 1.04 mg/dL (ref 0.76–1.27)
Globulin, Total: 3 g/dL (ref 1.5–4.5)
Glucose: 113 mg/dL — ABNORMAL HIGH (ref 70–99)
Potassium: 4.7 mmol/L (ref 3.5–5.2)
Sodium: 139 mmol/L (ref 134–144)
Total Protein: 7.4 g/dL (ref 6.0–8.5)
eGFR: 86 mL/min/1.73 (ref >59)

## 2024-04-24 LAB — HEMOGLOBIN A1C
Est. average glucose Bld gHb Est-mCnc: 134 mg/dL
Hgb A1c MFr Bld: 6.3 % — ABNORMAL HIGH (ref 4.8–5.6)

## 2024-04-24 LAB — TSH: TSH: 1.58 u[IU]/mL (ref 0.450–4.500)

## 2024-04-24 LAB — PSA: Prostate Specific Ag, Serum: 0.3 ng/mL (ref 0.0–4.0)

## 2024-04-26 ENCOUNTER — Other Ambulatory Visit: Payer: Self-pay | Admitting: Physician Assistant

## 2024-04-26 DIAGNOSIS — G8929 Other chronic pain: Secondary | ICD-10-CM

## 2024-04-30 DIAGNOSIS — M25461 Effusion, right knee: Secondary | ICD-10-CM | POA: Diagnosis not present

## 2024-04-30 DIAGNOSIS — M25661 Stiffness of right knee, not elsewhere classified: Secondary | ICD-10-CM | POA: Diagnosis not present

## 2024-04-30 DIAGNOSIS — R2689 Other abnormalities of gait and mobility: Secondary | ICD-10-CM | POA: Diagnosis not present

## 2024-04-30 DIAGNOSIS — M25561 Pain in right knee: Secondary | ICD-10-CM | POA: Diagnosis not present

## 2024-05-01 NOTE — Progress Notes (Signed)
 Aaron Holland                                          MRN: [id number]   05/01/2024   The [facility] Quality Team Specialist reviewed this patient medical record for the purposes of chart review for care gap closure. The following were reviewed: abstraction for care gap closure-controlling blood pressure.    [facility] Quality Team

## 2024-05-28 DIAGNOSIS — R2689 Other abnormalities of gait and mobility: Secondary | ICD-10-CM | POA: Diagnosis not present

## 2024-05-28 DIAGNOSIS — M25561 Pain in right knee: Secondary | ICD-10-CM | POA: Diagnosis not present

## 2024-05-28 DIAGNOSIS — M25661 Stiffness of right knee, not elsewhere classified: Secondary | ICD-10-CM | POA: Diagnosis not present

## 2024-05-28 DIAGNOSIS — M25461 Effusion, right knee: Secondary | ICD-10-CM | POA: Diagnosis not present

## 2024-06-02 DIAGNOSIS — Z79891 Long term (current) use of opiate analgesic: Secondary | ICD-10-CM | POA: Diagnosis not present

## 2024-06-02 DIAGNOSIS — M169 Osteoarthritis of hip, unspecified: Secondary | ICD-10-CM | POA: Diagnosis not present

## 2024-06-18 ENCOUNTER — Other Ambulatory Visit: Payer: Self-pay | Admitting: Physician Assistant

## 2024-06-18 DIAGNOSIS — E119 Type 2 diabetes mellitus without complications: Secondary | ICD-10-CM

## 2024-06-22 ENCOUNTER — Other Ambulatory Visit: Payer: Self-pay | Admitting: Physician Assistant

## 2024-06-26 DIAGNOSIS — H524 Presbyopia: Secondary | ICD-10-CM | POA: Diagnosis not present

## 2024-07-03 ENCOUNTER — Other Ambulatory Visit: Payer: Self-pay | Admitting: Family Medicine

## 2024-07-21 DIAGNOSIS — M545 Low back pain, unspecified: Secondary | ICD-10-CM

## 2024-08-05 NOTE — Progress Notes (Signed)
 Aaron Holland                                          MRN: [id number]   08/05/2024   The [facility] Quality Team Specialist reviewed this patient medical record for the purposes of chart review for care gap closure. The following were reviewed: chart review for care gap closure-diabetic eye exam.    [facility] Quality Team

## 2024-10-24 ENCOUNTER — Ambulatory Visit: Admitting: Physician Assistant

## 2025-01-01 ENCOUNTER — Ambulatory Visit
